# Patient Record
Sex: Male | Born: 1955 | Race: White | Hispanic: No | Smoking: Never smoker
Health system: Southern US, Community
[De-identification: ages and names within clinical notes are randomized; demographics above are authoritative.]

## PROBLEM LIST (undated history)

## (undated) DIAGNOSIS — R0602 Shortness of breath: Secondary | ICD-10-CM

## (undated) DIAGNOSIS — G8929 Other chronic pain: Secondary | ICD-10-CM

## (undated) DIAGNOSIS — K219 Gastro-esophageal reflux disease without esophagitis: Secondary | ICD-10-CM

## (undated) DIAGNOSIS — F3289 Other specified depressive episodes: Secondary | ICD-10-CM

## (undated) DIAGNOSIS — IMO0001 Reserved for inherently not codable concepts without codable children: Secondary | ICD-10-CM

## (undated) DIAGNOSIS — D649 Anemia, unspecified: Secondary | ICD-10-CM

## (undated) DIAGNOSIS — J984 Other disorders of lung: Secondary | ICD-10-CM

## (undated) DIAGNOSIS — I509 Heart failure, unspecified: Secondary | ICD-10-CM

## (undated) DIAGNOSIS — I10 Essential (primary) hypertension: Secondary | ICD-10-CM

## (undated) DIAGNOSIS — F329 Major depressive disorder, single episode, unspecified: Secondary | ICD-10-CM

## (undated) DIAGNOSIS — R519 Headache, unspecified: Secondary | ICD-10-CM

## (undated) DIAGNOSIS — J42 Unspecified chronic bronchitis: Secondary | ICD-10-CM

## (undated) DIAGNOSIS — R911 Solitary pulmonary nodule: Secondary | ICD-10-CM

## (undated) DIAGNOSIS — A419 Sepsis, unspecified organism: Secondary | ICD-10-CM

## (undated) DIAGNOSIS — S0181XA Laceration without foreign body of other part of head, initial encounter: Secondary | ICD-10-CM

## (undated) DIAGNOSIS — J189 Pneumonia, unspecified organism: Secondary | ICD-10-CM

## (undated) DIAGNOSIS — R079 Chest pain, unspecified: Secondary | ICD-10-CM

## (undated) DIAGNOSIS — E1165 Type 2 diabetes mellitus with hyperglycemia: Secondary | ICD-10-CM

## (undated) DIAGNOSIS — K7581 Nonalcoholic steatohepatitis (NASH): Secondary | ICD-10-CM

## (undated) DIAGNOSIS — S2249XA Multiple fractures of ribs, unspecified side, initial encounter for closed fracture: Secondary | ICD-10-CM

## (undated) DIAGNOSIS — N189 Chronic kidney disease, unspecified: Secondary | ICD-10-CM

## (undated) DIAGNOSIS — R51 Headache: Secondary | ICD-10-CM

## (undated) HISTORY — DX: Other specified depressive episodes: F32.89

## (undated) HISTORY — DX: Other disorders of lung: J98.4

## (undated) HISTORY — DX: Type 2 diabetes mellitus with hyperglycemia: E11.65

## (undated) HISTORY — DX: Gastro-esophageal reflux disease without esophagitis: K21.9

## (undated) HISTORY — DX: Headache, unspecified: R51.9

## (undated) HISTORY — DX: Reserved for inherently not codable concepts without codable children: IMO0001

## (undated) HISTORY — PX: NEPHROSTOMY: SHX1014

## (undated) HISTORY — DX: Shortness of breath: R06.02

## (undated) HISTORY — DX: Headache: R51

## (undated) HISTORY — DX: Essential (primary) hypertension: I10

## (undated) HISTORY — DX: Other chronic pain: G89.29

## (undated) HISTORY — DX: Nonalcoholic steatohepatitis (NASH): K75.81

## (undated) HISTORY — DX: Major depressive disorder, single episode, unspecified: F32.9

---

## 1974-09-25 HISTORY — PX: LUMBAR LAMINECTOMY: SHX95

## 1994-09-25 HISTORY — PX: OTHER SURGICAL HISTORY: SHX169

## 2007-09-26 HISTORY — PX: OTHER SURGICAL HISTORY: SHX169

## 2008-06-09 ENCOUNTER — Ambulatory Visit (HOSPITAL_COMMUNITY): Admission: RE | Admit: 2008-06-09 | Discharge: 2008-06-09 | Payer: Self-pay | Admitting: Urology

## 2011-04-07 DIAGNOSIS — R0602 Shortness of breath: Secondary | ICD-10-CM

## 2011-05-02 ENCOUNTER — Encounter: Payer: Self-pay | Admitting: Cardiology

## 2011-05-04 ENCOUNTER — Ambulatory Visit: Payer: Self-pay | Admitting: Cardiology

## 2011-07-09 DIAGNOSIS — R0789 Other chest pain: Secondary | ICD-10-CM

## 2011-07-10 DIAGNOSIS — R079 Chest pain, unspecified: Secondary | ICD-10-CM

## 2011-07-10 DIAGNOSIS — R0602 Shortness of breath: Secondary | ICD-10-CM

## 2013-05-13 DIAGNOSIS — R079 Chest pain, unspecified: Secondary | ICD-10-CM

## 2014-01-29 ENCOUNTER — Encounter (INDEPENDENT_AMBULATORY_CARE_PROVIDER_SITE_OTHER): Payer: Self-pay

## 2014-04-08 HISTORY — PX: COLONOSCOPY WITH ESOPHAGOGASTRODUODENOSCOPY (EGD): SHX5779

## 2014-04-08 HISTORY — PX: OTHER SURGICAL HISTORY: SHX169

## 2014-05-26 ENCOUNTER — Encounter: Payer: Self-pay | Admitting: Internal Medicine

## 2014-06-25 ENCOUNTER — Ambulatory Visit: Payer: Medicare Other | Admitting: Gastroenterology

## 2014-11-23 ENCOUNTER — Encounter: Payer: Self-pay | Admitting: Internal Medicine

## 2014-12-07 ENCOUNTER — Encounter: Payer: Self-pay | Admitting: Internal Medicine

## 2014-12-07 ENCOUNTER — Encounter: Payer: Self-pay | Admitting: Nurse Practitioner

## 2014-12-07 ENCOUNTER — Ambulatory Visit (INDEPENDENT_AMBULATORY_CARE_PROVIDER_SITE_OTHER): Payer: Medicare HMO | Admitting: Nurse Practitioner

## 2014-12-07 VITALS — BP 131/81 | HR 105 | Temp 97.0°F | Ht 72.0 in | Wt 278.2 lb

## 2014-12-07 DIAGNOSIS — K5900 Constipation, unspecified: Secondary | ICD-10-CM | POA: Insufficient documentation

## 2014-12-07 NOTE — Patient Instructions (Signed)
1. We will start you on Amitiza 24 mcg twice a day. Take the medicine with food to help prevent nausea 2. There is no concerns for kidney function with this medicine. 3. Call us and let us know how this medicine works for your symptoms. If it works well we can call in a prescription for you. If it doesn't work well we can try something else 4. Return for follow-up in 4 weeks (1 month)

## 2014-12-07 NOTE — Assessment & Plan Note (Signed)
Long standing history of constipation on chronic narcotic pain medications. Has a colonoscopy 6 months ago with Dr. Britta Mccreedy, will request those records. Denies reg flag/warning signs like overt GI bleeding, fever/chills, unintentional weight loss. Likely opiod-induced constipation, failed Miralax. Will trial Amitiza 24 mcg bid for symptom improvement. Return for follow-up in 4 weeks.

## 2014-12-07 NOTE — Progress Notes (Signed)
Primary Care Physician:  Deloria Lair, MD Primary Gastroenterologist:  Dr. Gala Romney  Chief Complaint  Patient presents with  . Constipation  . Anal Fissure  . swelling    HPI:   59 year old male presents on referral from urologist for chronic constipation and anal fissure.   Today he states he will occasionally have a bowel movement 2-3 times a day but will sometimes go 2-3 days without a bowel movement. The frequent stool days occur about every 2-3 weeks. This is episodic and will rotate all over the specturm from daily bowel movements to days without a bowel movement to days with 2-3 bowel movements. Tried Miralax daily, but when he doesn't take it then it takes "quite a while" to get back to normal. Will miss the Miralax about once a week. Admits straining when he's having a bout of constipation. Denies hemaotchezia and melena. Has dull abdominal and rectal pain with straining. Denies sharp rectal pain with straining. Denies any other abdominal pain. Does have an abdominal hernia. Admits occasional nausea when constipated but denies vomiting. Drinks about 4 glasses of water a day, consumes minimal fiber foods. Takes oxycodone 10/325 about 5-6 times a day for chronic back pain. Symptoms have been occurring "for years." Denies any other upper or lower GI problems. Last colonoscopy with Dr. Britta Mccreedy 6 months ago. Does not know specific results but heard "he may have some trouble back there." Will request these records. Denies fever, chills, unintentional weight loss, changes in appetite.   Past Medical History  Diagnosis Date  . Shortness of breath   . Type II or unspecified type diabetes mellitus without mention of complication, uncontrolled   . Other diseases of lung, not elsewhere classified   . Unspecified essential hypertension   . Depressive disorder, not elsewhere classified   . HBP (high blood pressure)   . GERD (gastroesophageal reflux disease)   . Steatohepatitis   . Chronic  headaches     Past Surgical History  Procedure Laterality Date  . Lumbar laminectomy  1976  . Cystoscopic left ureteral stone removal  1996  . Circumcision for chronic balanitis  2009    Current Outpatient Prescriptions  Medication Sig Dispense Refill  . albuterol (PROVENTIL,VENTOLIN) 90 MCG/ACT inhaler Inhale 2 puffs into the lungs every 6 (six) hours as needed.      . ALPRAZolam (XANAX) 1 MG tablet Take 1 mg by mouth 3 (three) times daily as needed.      Marland Kitchen amitriptyline (ELAVIL) 50 MG tablet Take 100 mg by mouth at bedtime.      Marland Kitchen aspirin 81 MG EC tablet Take 81 mg by mouth daily.      . B Complex Vitamins (B-COMPLEX/B-12 PO) Take 2,500 capsules by mouth daily.    . ciprofloxacin (CIPRO) 500 MG tablet Take 500 mg by mouth 2 (two) times daily.  0  . esomeprazole (NEXIUM) 20 MG capsule Take 20 mg by mouth daily before breakfast.      . fluticasone (VERAMYST) 27.5 MCG/SPRAY nasal spray Place 1 spray into the nose daily.      . Fluticasone-Salmeterol (ADVAIR DISKUS) 500-50 MCG/DOSE AEPB Inhale 1 puff into the lungs every 12 (twelve) hours.      . gabapentin (NEURONTIN) 300 MG capsule Take 600 mg by mouth 3 (three) times daily.      Marland Kitchen HYDROcodone-acetaminophen (LORCET) 10-650 MG per tablet Take 1 tablet by mouth every 4 (four) hours as needed.      . insulin glargine (  LANTUS) 100 UNIT/ML injection Inject 70 Units into the skin 2 (two) times daily.      . metoCLOPramide (REGLAN) 10 MG tablet Take 10 mg by mouth 4 (four) times daily. Before meals and at bedtime     . oxyCODONE-acetaminophen (PERCOCET) 10-325 MG per tablet 1 tablet every 4 (four) hours as needed for pain.   0  . PARoxetine (PAXIL) 40 MG tablet Take 40 mg by mouth every morning.      . tamsulosin (FLOMAX) 0.4 MG CAPS capsule Take 0.4 mg by mouth daily after supper.     No current facility-administered medications for this visit.    Allergies as of 12/07/2014  . (No Known Allergies)    No family history on  file.  History   Social History  . Marital Status: Married    Spouse Name: MAE  . Number of Children: 0  . Years of Education: N/A   Occupational History  . DISABLED    Social History Main Topics  . Smoking status: Never Smoker   . Smokeless tobacco: Not on file  . Alcohol Use: No  . Drug Use: No  . Sexual Activity: Not on file   Other Topics Concern  . Not on file   Social History Narrative    Review of Systems: General: Negative for anorexia, weight loss, fever, chills, fatigue, weakness. ENT: Negative for hoarseness, difficulty swallowing. CV: Negative for chest pain, angina, palpitations, peripheral edema.  Respiratory: Negative for dyspnea at rest, dyspnea on exertion, wheezing.  GI: See history of present illness. MS: Admits low back pain consistent with history, no worse than baseline.  Derm: Negative for rash or itching.  Neuro: Negative for weakness, seizure, frequent headaches, memory loss, confusion.  Psych: Negative for anxiety, depression.  Endo: Negative for unusual weight change.  Heme: Negative for bruising or bleeding. Allergy: Negative for rash or hives.   Physical Exam: BP 131/81 mmHg  Pulse 105  Temp(Src) 97 F (36.1 C)  Ht 6' (1.829 m)  Wt 278 lb 3.2 oz (126.191 kg)  BMI 37.72 kg/m2 General:   Alert and oriented. Pleasant and cooperative. Well-nourished and well-developed.  Head:  Normocephalic and atraumatic. Eyes:  Without icterus, sclera clear and conjunctiva pink.  Ears:  Normal auditory acuity. Mouth:  No deformity or lesions, oral mucosa pink. No OP edema. Neck:  Supple, without mass or thyromegaly. Lungs:  Clear to auscultation bilaterally. No wheezes, rales, or rhonchi. No distress.  Heart:  S1, S2 present without murmurs appreciated.  Abdomen:  Rounded, +BS, soft, non-tender and non-distended. No HSM noted. No guarding or rebound. No masses appreciated. Abdominal hernia noted and is soft and easily reducible. Rectal:  Deferred   Msk:  Symmetrical without gross deformities. Normal posture. Pulses:  Normal pulses noted. Extremities:  Without clubbing or edema. Neurologic:  Alert and  oriented x4;  grossly normal neurologically. Skin:  Intact without significant lesions or rashes. Cervical Nodes:  No significant cervical adenopathy. Psych:  Alert and cooperative. Normal mood and affect.     12/07/2014 1:06 PM

## 2014-12-08 NOTE — Progress Notes (Signed)
cc'ed to pcp °

## 2015-01-07 ENCOUNTER — Encounter: Payer: Self-pay | Admitting: Nurse Practitioner

## 2015-01-07 ENCOUNTER — Ambulatory Visit (INDEPENDENT_AMBULATORY_CARE_PROVIDER_SITE_OTHER): Payer: Medicare HMO | Admitting: Nurse Practitioner

## 2015-01-07 VITALS — BP 141/93 | HR 100 | Temp 96.9°F | Ht 73.0 in | Wt 266.4 lb

## 2015-01-07 DIAGNOSIS — K5900 Constipation, unspecified: Secondary | ICD-10-CM | POA: Diagnosis not present

## 2015-01-07 MED ORDER — LUBIPROSTONE 24 MCG PO CAPS: 24.0000 ug | ORAL_CAPSULE | Freq: Two times a day (BID) | ORAL | Status: DC

## 2015-01-07 NOTE — Patient Instructions (Signed)
1. We sent in a prescription to your pharmacy for the Amitiza for constipation. Take it per the label instructions 2. Return for a follow-up in 3 months so we can check your progress.

## 2015-01-07 NOTE — Assessment & Plan Note (Signed)
59 year old male with long-standing history of constipation on chronic opioid pain medications for chronic back pain. At last visit he was complaining of constipation often going 3 or more days in between bowel movements. We started him on Amitiza 24 g twice a day. Today he states he's had a vast improvement in his symptoms. Denies any further abdominal pain, longest time in between bowel movements is one to 2 days. Denies any further straining. Denies hematochezia and melena. We will continue his Amitiza 24 g twice a day, prescription to be sent into pharmacy. Follow-up in 3 months for reevaluation of his symptoms.

## 2015-01-07 NOTE — Progress Notes (Signed)
Referring Provider: Deloria Lair., MD Primary Care Physician:  Deloria Lair, MD Primary GI: Dr. Gala Romney  Chief Complaint  Patient presents with  . Follow-up    HPI:   59 year old male presents for follow-up on constipation. The last office visit last month patient described multiple year history of constipation, takes chronic opioid pain medications including Vicodin 10/325 up to 5 times a day for chronic back pain. Failed MiraLAX with continued constipation and straining despite daily use. At last office visit he was trialed on Amitiza 24 g twice a day for symptom improvement with a routine follow-up in 4 weeks. We have since received his previous colonoscopy report mentioned previous note which is completed by Dr. Britta Mccreedy at [facility]. Findings included megacolon, no colonic or rectal polyps. Recommendation of iron pills, continue Nexium, laxatives as needed. Recommend repeat in 10 years.  Today he states he has 2-3 BMs a day and will go as long as 1-2 days between BMs, which is an improvement from last visit. Also states his abdominal pain is improved. Denies nausea and vomiting. Does not have to strain anymore either. Denies any other upper or lower GI problems. Admits to some GU symptoms including burning with urination and urinary frequency. Has an appointment scheduled with his PCP tomorrow morning.  Past Medical History  Diagnosis Date  . Shortness of breath   . Type II or unspecified type diabetes mellitus without mention of complication, uncontrolled   . Other diseases of lung, not elsewhere classified   . Unspecified essential hypertension   . Depressive disorder, not elsewhere classified   . HBP (high blood pressure)   . GERD (gastroesophageal reflux disease)   . Steatohepatitis   . Chronic headaches     Past Surgical History  Procedure Laterality Date  . Lumbar laminectomy  1976  . Cystoscopic left ureteral stone removal  1996  . Circumcision for  chronic balanitis  2009    Current Outpatient Prescriptions  Medication Sig Dispense Refill  . albuterol (PROVENTIL,VENTOLIN) 90 MCG/ACT inhaler Inhale 2 puffs into the lungs every 6 (six) hours as needed.      . ALPRAZolam (XANAX) 1 MG tablet Take 1 mg by mouth 3 (three) times daily as needed.      Marland Kitchen amitriptyline (ELAVIL) 50 MG tablet Take 100 mg by mouth at bedtime.      Marland Kitchen aspirin 81 MG EC tablet Take 81 mg by mouth daily.      . B Complex Vitamins (B-COMPLEX/B-12 PO) Take 2,500 capsules by mouth daily.    Marland Kitchen esomeprazole (NEXIUM) 20 MG capsule Take 20 mg by mouth daily before breakfast.      . fluticasone (VERAMYST) 27.5 MCG/SPRAY nasal spray Place 1 spray into the nose daily.      . Fluticasone-Salmeterol (ADVAIR DISKUS) 500-50 MCG/DOSE AEPB Inhale 1 puff into the lungs every 12 (twelve) hours.      . gabapentin (NEURONTIN) 300 MG capsule Take 600 mg by mouth 3 (three) times daily.      Marland Kitchen HYDROcodone-acetaminophen (LORCET) 10-650 MG per tablet Take 1 tablet by mouth every 4 (four) hours as needed.      . insulin glargine (LANTUS) 100 UNIT/ML injection Inject 70 Units into the skin 2 (two) times daily.      . metoCLOPramide (REGLAN) 10 MG tablet Take 10 mg by mouth 4 (four) times daily. Before meals and at bedtime     . oxyCODONE-acetaminophen (PERCOCET) 10-325 MG per tablet 1 tablet every 4 (  four) hours as needed for pain.   0  . PARoxetine (PAXIL) 40 MG tablet Take 40 mg by mouth every morning.      . tamsulosin (FLOMAX) 0.4 MG CAPS capsule Take 0.4 mg by mouth daily after supper.    . ciprofloxacin (CIPRO) 500 MG tablet Take 500 mg by mouth 2 (two) times daily.  0   No current facility-administered medications for this visit.    Allergies as of 01/07/2015 - Review Complete 01/07/2015  Allergen Reaction Noted  . Meperidine Other (See Comments) 01/07/2015    No family history on file.  History   Social History  . Marital Status: Married    Spouse Name: MAE  . Number of  Children: 0  . Years of Education: N/A   Occupational History  . DISABLED    Social History Main Topics  . Smoking status: Never Smoker   . Smokeless tobacco: Not on file  . Alcohol Use: No  . Drug Use: No  . Sexual Activity: Not on file   Other Topics Concern  . None   Social History Narrative    Review of Systems: Gen: Denies fever, chills, anorexia. Denies fatigue, weakness, weight loss. Feels achy though. CV: Denies chest pain, palpitations, syncope. Resp: Denies dyspnea at rest, cough, wheezing, coughing up blood, and pleurisy. GI: Denies vomiting blood, jaundice, and fecal incontinence.   Denies dysphagia or odynophagia. GU: Admits painful urination and frequency. Derm: Denies rash, itching, dry skin Psych: Denies depression, anxiety, memory loss, confusion.  Heme: Denies bruising, bleeding, and enlarged lymph nodes.  Physical Exam: BP 141/93 mmHg  Pulse 100  Temp(Src) 96.9 F (36.1 C)  Ht 6' 1"  (1.854 m)  Wt 266 lb 6.4 oz (120.838 kg)  BMI 35.15 kg/m2 General:   Alert and oriented. No distress noted. Pleasant and cooperative.  Head:  Normocephalic and atraumatic. Eyes:  Conjuctiva clear without scleral icterus. Lungs:  Clear to auscultation bilaterally. No wheezes, rales, or rhonchi. No distress.  Heart:  S1, S2 present without murmurs, rubs, or gallops. Regular rate and rhythm. Abdomen:  +BS, soft, non-tender and non-distended. No rebound or guarding. No HSM or masses noted. Noted midline abdominal hernia is soft. Extremities:  Without edema. Neurologic:  Alert and  oriented x4;  grossly normal neurologically. Skin:  Intact without significant lesions or rashes. Psych:  Alert and cooperative. Normal mood and affect.    01/07/2015 8:11 AM

## 2015-01-07 NOTE — Progress Notes (Signed)
cc'ed to pcp °

## 2015-01-27 ENCOUNTER — Encounter: Payer: Self-pay | Admitting: Internal Medicine

## 2015-04-08 ENCOUNTER — Encounter: Payer: Self-pay | Admitting: Nurse Practitioner

## 2015-04-08 ENCOUNTER — Ambulatory Visit (INDEPENDENT_AMBULATORY_CARE_PROVIDER_SITE_OTHER): Payer: Medicare HMO | Admitting: Nurse Practitioner

## 2015-04-08 VITALS — BP 129/77 | HR 109 | Temp 97.5°F | Ht 73.0 in | Wt 270.0 lb

## 2015-04-08 DIAGNOSIS — K5900 Constipation, unspecified: Secondary | ICD-10-CM

## 2015-04-08 NOTE — Assessment & Plan Note (Signed)
Patient with constipation likely due to narcotic pain medication for chronic pain. Amitiza worked initially however stop working so he stopped taking it. He has noted that dairy causes severe constipation and he has been avoiding dairy. Between avoiding dairy and adding daily MiraLAX he has a daily bowel movement with no straining and is satisfied with the results. If he misses a dose of MiraLAX however he will have worsening constipation which take 2-3 days ago however. At this point we'll have him continue daily MiraLAX, avoid dairy, and we'll add over-the-counter Colace stool softener given his narcotic pain medications. Return for follow-up in 6 months to assess long-term effects of this management plan.

## 2015-04-08 NOTE — Progress Notes (Signed)
Referring Provider: Deloria Lair., MD Primary Care Physician:  Deloria Lair, MD Primary GI:  Dr. Gala Romney  Chief Complaint  Patient presents with  . Constipation    HPI:   59 year old male presents for follow-up on constipation. Last visit on 01/07/2015 patient was much improved with to 3 BMs a day and is infrequently is 1-2 days in between BMs. Abdominal pain was also improved. No further straining. Colonoscopy up-to-date completed in 2015 with recommended repeat in 10 years. Was last prescribed Amitiza.  Today he states the Amitiza worked initially but then stopped working so he's stopped taking it. He has been taking Miralax daily and avoiding dairy. He states if he has any dairy products he'll be severely constipated. If he avoids dairy Mirlax adequately controls his symptoms. Denies abdominal pain. Currently having a bowel movement about once a day, rarely has to strain. Denies hematochezia, melena, vomiting. Does have bloating with early satiety and associated nausea for which he takes Reglan and quickly resolves. Denies chest pain, dyspnea, dizziness, lightheadedness, syncope, near syncope. Denies any other upper or lower GI symptoms.  Past Medical History  Diagnosis Date  . Shortness of breath   . Type II or unspecified type diabetes mellitus without mention of complication, uncontrolled   . Other diseases of lung, not elsewhere classified   . Unspecified essential hypertension   . Depressive disorder, not elsewhere classified   . HBP (high blood pressure)   . GERD (gastroesophageal reflux disease)   . Steatohepatitis   . Chronic headaches     Past Surgical History  Procedure Laterality Date  . Lumbar laminectomy  1976  . Cystoscopic left ureteral stone removal  1996  . Circumcision for chronic balanitis  2009    Current Outpatient Prescriptions  Medication Sig Dispense Refill  . albuterol (PROVENTIL,VENTOLIN) 90 MCG/ACT inhaler Inhale 2 puffs into the lungs every  6 (six) hours as needed.      . ALPRAZolam (XANAX) 1 MG tablet Take 1 mg by mouth 3 (three) times daily as needed.      Marland Kitchen amitriptyline (ELAVIL) 50 MG tablet Take 100 mg by mouth at bedtime.      Marland Kitchen aspirin 81 MG EC tablet Take 81 mg by mouth daily.      . B Complex Vitamins (B-COMPLEX/B-12 PO) Take 2,500 capsules by mouth daily.    Marland Kitchen esomeprazole (NEXIUM) 20 MG capsule Take 20 mg by mouth daily before breakfast.      . fluticasone (VERAMYST) 27.5 MCG/SPRAY nasal spray Place 1 spray into the nose daily.      . Fluticasone-Salmeterol (ADVAIR DISKUS) 500-50 MCG/DOSE AEPB Inhale 1 puff into the lungs every 12 (twelve) hours.      . gabapentin (NEURONTIN) 300 MG capsule Take 600 mg by mouth 3 (three) times daily.      . insulin glargine (LANTUS) 100 UNIT/ML injection Inject 70 Units into the skin 2 (two) times daily.      . metoCLOPramide (REGLAN) 10 MG tablet Take 10 mg by mouth 4 (four) times daily. Before meals and at bedtime     . oxyCODONE-acetaminophen (PERCOCET) 10-325 MG per tablet 1 tablet every 4 (four) hours as needed for pain.   0  . PARoxetine (PAXIL) 40 MG tablet Take 40 mg by mouth every morning.      . tamsulosin (FLOMAX) 0.4 MG CAPS capsule Take 0.4 mg by mouth daily after supper.    . ciprofloxacin (CIPRO) 500 MG tablet Take 500 mg by mouth  2 (two) times daily.  0  . lubiprostone (AMITIZA) 24 MCG capsule Take 1 capsule (24 mcg total) by mouth 2 (two) times daily with a meal. (Patient not taking: Reported on 04/08/2015) 60 capsule 3   No current facility-administered medications for this visit.    Allergies as of 04/08/2015 - Review Complete 04/08/2015  Allergen Reaction Noted  . Meperidine Other (See Comments) 01/07/2015    No family history on file.  History   Social History  . Marital Status: Married    Spouse Name: MAE  . Number of Children: 0  . Years of Education: N/A   Occupational History  . DISABLED    Social History Main Topics  . Smoking status: Never  Smoker   . Smokeless tobacco: Not on file  . Alcohol Use: No  . Drug Use: No  . Sexual Activity: Not on file   Other Topics Concern  . None   Social History Narrative    Review of Systems: General: Negative for anorexia, weight loss, fever, chills, fatigue, weakness. CV: Negative for chest pain, angina, palpitations, dyspnea on exertion, peripheral edema.  Respiratory: Negative for dyspnea at rest, dyspnea on exertion, cough, sputum, wheezing.  GI: See history of present illness. Endo: Negative for unusual weight change.  Heme: Negative for bruising or bleeding. Allergy: Negative for rash or hives.   Physical Exam: BP 129/77 mmHg  Pulse 109  Temp(Src) 97.5 F (36.4 C) (Oral)  Ht 6' 1"  (1.854 m)  Wt 270 lb (122.471 kg)  BMI 35.63 kg/m2 General:   Alert and oriented. Pleasant and cooperative. Well-nourished and well-developed.  Head:  Normocephalic and atraumatic. Cardiovascular:  S1, S2 present without murmurs appreciated. Normal pulses noted. Extremities without clubbing or edema. Respiratory:  Clear to auscultation bilaterally. No wheezes, rales, or rhonchi. No distress.  Gastrointestinal:  +BS, obese, soft, non-tender and non-distended. No HSM noted. No guarding or rebound. No masses appreciated. Midline abdominal hernia noted and non-tender, easily reducible. Rectal:  Deferred  Neurologic:  Alert and oriented x4;  grossly normal neurologically. Psych:  Alert and cooperative. Normal mood and affect. Heme/Lymph/Immune: No excessive bruising noted.    04/08/2015 1:33 PM

## 2015-04-08 NOTE — Patient Instructions (Signed)
1. Continue to avoid dairy products. 2. Continue taking MiraLAX daily. 3. Add over-the-counter Colace stool softener to help promote regular bowel movements and no straining. 4. Return for follow-up in 6 months.

## 2015-04-09 NOTE — Progress Notes (Signed)
cc'ed to pcp °

## 2015-09-15 ENCOUNTER — Encounter: Payer: Self-pay | Admitting: Internal Medicine

## 2016-06-08 ENCOUNTER — Other Ambulatory Visit (HOSPITAL_COMMUNITY): Payer: Self-pay | Admitting: Respiratory Therapy

## 2016-06-08 DIAGNOSIS — J441 Chronic obstructive pulmonary disease with (acute) exacerbation: Secondary | ICD-10-CM

## 2016-06-16 ENCOUNTER — Ambulatory Visit (HOSPITAL_COMMUNITY)
Admission: RE | Admit: 2016-06-16 | Discharge: 2016-06-16 | Disposition: A | Discharge status: Home / Self Care | Payer: Medicare HMO | Source: Ambulatory Visit | Attending: Pulmonary Disease | Admitting: Pulmonary Disease

## 2016-06-16 DIAGNOSIS — J441 Chronic obstructive pulmonary disease with (acute) exacerbation: Secondary | ICD-10-CM | POA: Insufficient documentation

## 2016-06-16 MED ORDER — ALBUTEROL SULFATE (2.5 MG/3ML) 0.083% IN NEBU
2.5000 mg | INHALATION_SOLUTION | Freq: Once | RESPIRATORY_TRACT | Status: AC
Start: 2016-06-16 — End: 2016-06-16
  Administered 2016-06-16: 2.5 mg via RESPIRATORY_TRACT

## 2016-06-18 LAB — PULMONARY FUNCTION TEST
DL/VA % PRED: 100 %
DL/VA: 4.8 ml/min/mmHg/L
DLCO UNC: 20.78 ml/min/mmHg
DLCO unc % pred: 57 %
FEF 25-75 POST: 1.64 L/s
FEF 25-75 Pre: 1.57 L/sec
FEF2575-%Change-Post: 4 %
FEF2575-%PRED-POST: 52 %
FEF2575-%Pred-Pre: 50 %
FEV1-%CHANGE-POST: 0 %
FEV1-%PRED-PRE: 52 %
FEV1-%Pred-Post: 51 %
FEV1-Post: 2.03 L
FEV1-Pre: 2.05 L
FEV1FVC-%Change-Post: 1 %
FEV1FVC-%PRED-PRE: 99 %
FEV6-%Change-Post: -2 %
FEV6-%Pred-Post: 53 %
FEV6-%Pred-Pre: 55 %
FEV6-Post: 2.67 L
FEV6-Pre: 2.74 L
FEV6FVC-%CHANGE-POST: 0 %
FEV6FVC-%Pred-Post: 104 %
FEV6FVC-%Pred-Pre: 105 %
FVC-%Change-Post: -2 %
FVC-%PRED-POST: 51 %
FVC-%PRED-PRE: 52 %
FVC-PRE: 2.74 L
FVC-Post: 2.68 L
POST FEV1/FVC RATIO: 76 %
PRE FEV6/FVC RATIO: 100 %
Post FEV6/FVC ratio: 100 %
Pre FEV1/FVC ratio: 75 %
RV % pred: 170 %
RV: 4.2 L
TLC % pred: 92 %
TLC: 7.02 L

## 2016-10-20 ENCOUNTER — Observation Stay (HOSPITAL_COMMUNITY): Payer: Medicare HMO

## 2016-10-20 ENCOUNTER — Emergency Department (HOSPITAL_COMMUNITY): Payer: Medicare HMO

## 2016-10-20 ENCOUNTER — Observation Stay (HOSPITAL_COMMUNITY)
Admit: 2016-10-20 | Discharge: 2016-10-20 | Disposition: A | Discharge status: Home / Self Care | Payer: Medicare HMO | Attending: Neurology | Admitting: Neurology

## 2016-10-20 ENCOUNTER — Observation Stay (HOSPITAL_BASED_OUTPATIENT_CLINIC_OR_DEPARTMENT_OTHER): Payer: Medicare HMO

## 2016-10-20 ENCOUNTER — Observation Stay (HOSPITAL_COMMUNITY)
Admission: EM | Admit: 2016-10-20 | Discharge: 2016-10-21 | Disposition: A | Discharge status: Home / Self Care | Payer: Medicare HMO | Attending: Internal Medicine | Admitting: Internal Medicine

## 2016-10-20 ENCOUNTER — Encounter (HOSPITAL_COMMUNITY): Payer: Self-pay | Admitting: *Deleted

## 2016-10-20 DIAGNOSIS — Z7982 Long term (current) use of aspirin: Secondary | ICD-10-CM | POA: Insufficient documentation

## 2016-10-20 DIAGNOSIS — N289 Disorder of kidney and ureter, unspecified: Secondary | ICD-10-CM | POA: Diagnosis not present

## 2016-10-20 DIAGNOSIS — F418 Other specified anxiety disorders: Secondary | ICD-10-CM | POA: Diagnosis present

## 2016-10-20 DIAGNOSIS — G894 Chronic pain syndrome: Secondary | ICD-10-CM | POA: Diagnosis present

## 2016-10-20 DIAGNOSIS — Z79899 Other long term (current) drug therapy: Secondary | ICD-10-CM | POA: Diagnosis not present

## 2016-10-20 DIAGNOSIS — N183 Chronic kidney disease, stage 3 unspecified: Secondary | ICD-10-CM | POA: Diagnosis present

## 2016-10-20 DIAGNOSIS — G9340 Encephalopathy, unspecified: Principal | ICD-10-CM | POA: Diagnosis present

## 2016-10-20 DIAGNOSIS — I130 Hypertensive heart and chronic kidney disease with heart failure and stage 1 through stage 4 chronic kidney disease, or unspecified chronic kidney disease: Secondary | ICD-10-CM | POA: Insufficient documentation

## 2016-10-20 DIAGNOSIS — E1122 Type 2 diabetes mellitus with diabetic chronic kidney disease: Secondary | ICD-10-CM | POA: Diagnosis not present

## 2016-10-20 DIAGNOSIS — J9611 Chronic respiratory failure with hypoxia: Secondary | ICD-10-CM | POA: Diagnosis present

## 2016-10-20 DIAGNOSIS — I509 Heart failure, unspecified: Secondary | ICD-10-CM | POA: Diagnosis not present

## 2016-10-20 DIAGNOSIS — Z79891 Long term (current) use of opiate analgesic: Secondary | ICD-10-CM | POA: Diagnosis not present

## 2016-10-20 DIAGNOSIS — E1121 Type 2 diabetes mellitus with diabetic nephropathy: Secondary | ICD-10-CM | POA: Diagnosis present

## 2016-10-20 DIAGNOSIS — R4182 Altered mental status, unspecified: Secondary | ICD-10-CM | POA: Diagnosis present

## 2016-10-20 DIAGNOSIS — J449 Chronic obstructive pulmonary disease, unspecified: Secondary | ICD-10-CM | POA: Diagnosis present

## 2016-10-20 HISTORY — DX: Solitary pulmonary nodule: R91.1

## 2016-10-20 HISTORY — DX: Morbid (severe) obesity due to excess calories: E66.01

## 2016-10-20 HISTORY — DX: Heart failure, unspecified: I50.9

## 2016-10-20 HISTORY — DX: Laceration without foreign body of other part of head, initial encounter: S01.81XA

## 2016-10-20 HISTORY — DX: Unspecified chronic bronchitis: J42

## 2016-10-20 HISTORY — DX: Chronic kidney disease, unspecified: N18.9

## 2016-10-20 HISTORY — DX: Sepsis, unspecified organism: A41.9

## 2016-10-20 HISTORY — DX: Multiple fractures of ribs, unspecified side, initial encounter for closed fracture: S22.49XA

## 2016-10-20 HISTORY — DX: Pneumonia, unspecified organism: J18.9

## 2016-10-20 HISTORY — DX: Chest pain, unspecified: R07.9

## 2016-10-20 HISTORY — DX: Anemia, unspecified: D64.9

## 2016-10-20 LAB — ECHOCARDIOGRAM COMPLETE
HEIGHTINCHES: 74 in
Weight: 4608 oz

## 2016-10-20 LAB — VITAMIN B12: Vitamin B-12: 1090 pg/mL — ABNORMAL HIGH (ref 180–914)

## 2016-10-20 LAB — URINALYSIS, ROUTINE W REFLEX MICROSCOPIC
BACTERIA UA: NONE SEEN
BILIRUBIN URINE: NEGATIVE
Glucose, UA: 50 mg/dL — AB
KETONES UR: NEGATIVE mg/dL
LEUKOCYTES UA: NEGATIVE
NITRITE: NEGATIVE
Protein, ur: NEGATIVE mg/dL
SPECIFIC GRAVITY, URINE: 1.012 (ref 1.005–1.030)
pH: 5 (ref 5.0–8.0)

## 2016-10-20 LAB — CBG MONITORING, ED
Glucose-Capillary: 160 mg/dL — ABNORMAL HIGH (ref 65–99)
Glucose-Capillary: 201 mg/dL — ABNORMAL HIGH (ref 65–99)
Glucose-Capillary: 166 mg/dL — ABNORMAL HIGH (ref 65–99)

## 2016-10-20 LAB — BLOOD GAS, ARTERIAL
ACID-BASE EXCESS: 4.8 mmol/L — AB (ref 0.0–2.0)
ALLENS TEST (PASS/FAIL): POSITIVE — AB
Bicarbonate: 28.1 mmol/L — ABNORMAL HIGH (ref 20.0–28.0)
DRAWN BY: 382351
FIO2: 28
O2 Saturation: 95.9 %
PCO2 ART: 50.7 mmHg — AB (ref 32.0–48.0)
PH ART: 7.384 (ref 7.350–7.450)
Patient temperature: 37
pO2, Arterial: 83.7 mmHg (ref 83.0–108.0)

## 2016-10-20 LAB — COMPREHENSIVE METABOLIC PANEL
ALBUMIN: 3.5 g/dL (ref 3.5–5.0)
ALT: 18 U/L (ref 17–63)
AST: 23 U/L (ref 15–41)
Alkaline Phosphatase: 65 U/L (ref 38–126)
Anion gap: 9 (ref 5–15)
BUN: 29 mg/dL — AB (ref 6–20)
CHLORIDE: 94 mmol/L — AB (ref 101–111)
CO2: 31 mmol/L (ref 22–32)
CREATININE: 2.55 mg/dL — AB (ref 0.61–1.24)
Calcium: 8.7 mg/dL — ABNORMAL LOW (ref 8.9–10.3)
GFR calc Af Amer: 29 mL/min — ABNORMAL LOW (ref >60)
GFR, EST NON AFRICAN AMERICAN: 25 mL/min — AB (ref >60)
GLUCOSE: 148 mg/dL — AB (ref 65–99)
Potassium: 3.8 mmol/L (ref 3.5–5.1)
SODIUM: 134 mmol/L — AB (ref 135–145)
Total Bilirubin: 0.7 mg/dL (ref 0.3–1.2)
Total Protein: 8 g/dL (ref 6.5–8.1)

## 2016-10-20 LAB — RAPID URINE DRUG SCREEN, HOSP PERFORMED
Amphetamines: NOT DETECTED
BARBITURATES: NOT DETECTED
Benzodiazepines: POSITIVE — AB
COCAINE: NOT DETECTED
OPIATES: NOT DETECTED
TETRAHYDROCANNABINOL: NOT DETECTED

## 2016-10-20 LAB — GLUCOSE, CAPILLARY
GLUCOSE-CAPILLARY: 140 mg/dL — AB (ref 65–99)
GLUCOSE-CAPILLARY: 215 mg/dL — AB (ref 65–99)

## 2016-10-20 LAB — CBC
HEMATOCRIT: 35.2 % — AB (ref 39.0–52.0)
Hemoglobin: 11.6 g/dL — ABNORMAL LOW (ref 13.0–17.0)
MCH: 30.1 pg (ref 26.0–34.0)
MCHC: 33 g/dL (ref 30.0–36.0)
MCV: 91.4 fL (ref 78.0–100.0)
PLATELETS: 214 10*3/uL (ref 150–400)
RBC: 3.85 MIL/uL — ABNORMAL LOW (ref 4.22–5.81)
RDW: 13.1 % (ref 11.5–15.5)
WBC: 7.9 10*3/uL (ref 4.0–10.5)

## 2016-10-20 LAB — AMMONIA: Ammonia: 36 umol/L — ABNORMAL HIGH (ref 9–35)

## 2016-10-20 LAB — BRAIN NATRIURETIC PEPTIDE: B Natriuretic Peptide: 38 pg/mL (ref 0.0–100.0)

## 2016-10-20 LAB — TSH: TSH: 2.049 u[IU]/mL (ref 0.350–4.500)

## 2016-10-20 LAB — TROPONIN I

## 2016-10-20 LAB — LACTIC ACID, PLASMA: LACTIC ACID, VENOUS: 0.7 mmol/L (ref 0.5–1.9)

## 2016-10-20 MED ORDER — ALBUTEROL (5 MG/ML) CONTINUOUS INHALATION SOLN
10.0000 mg/h | INHALATION_SOLUTION | Freq: Once | RESPIRATORY_TRACT | Status: AC
  Administered 2016-10-20: 10 mg/h via RESPIRATORY_TRACT
  Filled 2016-10-20: qty 20

## 2016-10-20 MED ORDER — SODIUM CHLORIDE 0.9% FLUSH: 3.0000 mL | Freq: Two times a day (BID) | INTRAVENOUS | Status: DC

## 2016-10-20 MED ORDER — PANTOPRAZOLE SODIUM 40 MG PO TBEC
40.0000 mg | DELAYED_RELEASE_TABLET | Freq: Every day | ORAL | Status: DC
  Administered 2016-10-20: 40 mg via ORAL
  Filled 2016-10-20: qty 1

## 2016-10-20 MED ORDER — MOMETASONE FURO-FORMOTEROL FUM 200-5 MCG/ACT IN AERO
INHALATION_SPRAY | RESPIRATORY_TRACT | Status: AC
  Filled 2016-10-20: qty 8.8

## 2016-10-20 MED ORDER — SODIUM CHLORIDE 0.45 % IV SOLN
INTRAVENOUS | Status: DC
  Administered 2016-10-20: 1000 mL via INTRAVENOUS

## 2016-10-20 MED ORDER — GABAPENTIN 100 MG PO CAPS
100.0000 mg | ORAL_CAPSULE | Freq: Three times a day (TID) | ORAL | Status: DC
  Administered 2016-10-20 (×3): 100 mg via ORAL
  Filled 2016-10-20 (×3): qty 1

## 2016-10-20 MED ORDER — IPRATROPIUM-ALBUTEROL 0.5-2.5 (3) MG/3ML IN SOLN
3.0000 mL | Freq: Three times a day (TID) | RESPIRATORY_TRACT | Status: DC
  Administered 2016-10-20 – 2016-10-21 (×3): 3 mL via RESPIRATORY_TRACT
  Filled 2016-10-20 (×3): qty 3

## 2016-10-20 MED ORDER — SODIUM CHLORIDE 0.9% FLUSH: 3.0000 mL | INTRAVENOUS | Status: DC | PRN

## 2016-10-20 MED ORDER — INSULIN GLARGINE 100 UNIT/ML ~~LOC~~ SOLN
5.0000 [IU] | Freq: Two times a day (BID) | SUBCUTANEOUS | Status: DC
  Administered 2016-10-20 (×2): 5 [IU] via SUBCUTANEOUS
  Filled 2016-10-20 (×7): qty 0.05

## 2016-10-20 MED ORDER — ASPIRIN 81 MG PO CHEW
81.0000 mg | CHEWABLE_TABLET | Freq: Every day | ORAL | Status: DC
  Administered 2016-10-20: 81 mg via ORAL
  Filled 2016-10-20 (×2): qty 1

## 2016-10-20 MED ORDER — INSULIN ASPART 100 UNIT/ML ~~LOC~~ SOLN
0.0000 [IU] | SUBCUTANEOUS | Status: DC
  Administered 2016-10-20: 3 [IU] via SUBCUTANEOUS
  Administered 2016-10-20: 2 [IU] via SUBCUTANEOUS
  Administered 2016-10-20: 3 [IU] via SUBCUTANEOUS
  Administered 2016-10-20: 1 [IU] via SUBCUTANEOUS
  Administered 2016-10-21: 2 [IU] via SUBCUTANEOUS
  Administered 2016-10-21: 5 [IU] via SUBCUTANEOUS
  Filled 2016-10-20 (×2): qty 1

## 2016-10-20 MED ORDER — TAMSULOSIN HCL 0.4 MG PO CAPS
0.4000 mg | ORAL_CAPSULE | Freq: Every day | ORAL | Status: DC
  Administered 2016-10-20: 0.4 mg via ORAL
  Filled 2016-10-20: qty 1

## 2016-10-20 MED ORDER — FUROSEMIDE 10 MG/ML IJ SOLN
20.0000 mg | Freq: Every day | INTRAMUSCULAR | Status: DC
  Administered 2016-10-20 – 2016-10-21 (×2): 20 mg via INTRAVENOUS
  Filled 2016-10-20 (×2): qty 2

## 2016-10-20 MED ORDER — POTASSIUM CHLORIDE IN NACL 20-0.9 MEQ/L-% IV SOLN
INTRAVENOUS | Status: DC
  Administered 2016-10-20 – 2016-10-21 (×2): via INTRAVENOUS
  Filled 2016-10-20: qty 1000

## 2016-10-20 MED ORDER — PAROXETINE HCL 20 MG PO TABS: 40.0000 mg | ORAL_TABLET | Freq: Every day | ORAL | Status: DC

## 2016-10-20 MED ORDER — SODIUM CHLORIDE 0.9 % IV SOLN: 250.0000 mL | INTRAVENOUS | Status: DC | PRN

## 2016-10-20 MED ORDER — MOMETASONE FURO-FORMOTEROL FUM 200-5 MCG/ACT IN AERO
2.0000 | INHALATION_SPRAY | Freq: Two times a day (BID) | RESPIRATORY_TRACT | Status: DC
  Administered 2016-10-20 – 2016-10-21 (×2): 2 via RESPIRATORY_TRACT
  Filled 2016-10-20: qty 8.8

## 2016-10-20 NOTE — ED Notes (Signed)
Pt taking to MRI

## 2016-10-20 NOTE — Progress Notes (Signed)
Patient is a 61 year old man with a history of COPD, CKD, CHF, diabetes mellitus, depression/anxiety, chronic pain syndrome/chronic headaches, morbid obesity, who was admitted this morning by Dr. Shanon Brow for altered mental status. Patient was just discharged from the [facility] ED on 10/19/16 for AMS, UTI, and chest pain. Patient was briefly seen and examined. His chart was reviewed. Agree with current management with additions below.  -On exam, the patient is still encephalopathic and lethargic. He answers to his name and follows small commands, but falls back to sleep. -In review of the ED records for [facility], the patient had a marginally elevated troponin I, UTI, and urine drug screen positive for opiates. He was discharged from the ED on Cipro. -UDS this morning was positive for benzodiazepines and negative for opiates. -Troponin I is within normal limits. TSH is within normal limits. Ammonia level is virtually within normal limits. UA is essentially negative. CT of the head is negative. Chest x-ray reveals stable cardiomegaly with interval development of pulmonary edema, possible small left pleural effusion, and chronic elevation of the left hemidiaphragm, stable. -ABG on room air reveals pH of 7.38, PCO2 of 51, and PO2 of 84. PCO2 is likely at baseline. -Creatinine is 2.55. Patient has a history of CK D with an unknown baseline. Creatinine was 2.28 at [facility] on 1/25. -Glucose is 160-201.   1. Acute encephalopathy. Etiology is unclear, but could likely be from polypharmacy or unintentional  misuse of benzodiazepines, opiates coupled with other psychotropic medications. -Patient's chronic psychotropic medications include Xanax 3 times a day as needed, amitriptyline 150 mg daily at bedtime, gabapentin 300 mg 3 times a day, Percocet 10/325 every 4 hours as needed, and Paxil 40 mg daily. -These medications are on hold. -We'll order MRI of the brain and vitamin B12 level.  2. Pulmonary edema.  Patient has a history of CHF, diastolic or systolic dysfunction is unknown. He takes Lasix 40 mg daily. -We'll continue less than maintenance IV fluids and give Lasix 20 g IV daily. Will order 2-D echo.  3. Insulin requiring diabetes mellitus with neuropathy. Patient is treated with Lantus 70 units twice a day. He is treated with amitriptyline and gabapentin. -We'll continue sliding scale NovoLog and add 5 units Lantus twice a day.  4. COPD with mild hypercapnia-likely chronic. Patient is treated with albuterol inhaler 3 times a day and Atrovent nebulizer as needed. -We'll start DuoNeb tid.  5. Depression with anxiety. Patient is treated with Xanax, amitriptyline, and Paxil. -Continue to hold these medications.  6. Questionable UTI; diagnosed 1/25 at [facility]. Patient was discharged on Cipro. His urinalysis here is unremarkable. -We'll hold Cipro pending the urine culture.  7. 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 Patient's baseline creatinine is unknown. It was 2.55 on admission. 2.28 at [facility] on 1/25.

## 2016-10-20 NOTE — ED Notes (Signed)
Pt states his chest is hurting, pt seems to be drifting off to sleep & only mumbling when speaks.

## 2016-10-20 NOTE — H&P (Signed)
History and Physical    Javier Craw. SRP:[id number] DOB: [birth year] DOA: 10/20/2016  PCP: Deloria Lair, MD  Patient coming from: home  Chief Complaint:  confusion  HPI: Javier Craw. is a 61 y.o. male with medical history significant of DM, recent [facility] hospitalization for UTI, opiate use brought in by family for confusion.  Pt was just discharged from [facility] with uti.  He goes there a lot.  All history obtained from EDP. Per neighbor pt has not "been right" in weeks.  He lives with wife.  No report of fever.  No n/v/d.  Does not take extra pain pills that they know of.  Report of CKD but unknown how bad.  Pt work up in ED shows cr of 2.5 and he is sedated but responds to voice.  Referred for admission for encephalopathy of unclear etiology.  Narcan not given in ED.  uds pending.  Review of Systems: unobtainable due to ams  Past Medical History:  Diagnosis Date  . Chronic headaches   . Depressive disorder, not elsewhere classified   . GERD (gastroesophageal reflux disease)   . HBP (high blood pressure)   . Other diseases of lung, not elsewhere classified   . Shortness of breath   . Steatohepatitis   . Type II or unspecified type diabetes mellitus without mention of complication, uncontrolled   . Unspecified essential hypertension     Past Surgical History:  Procedure Laterality Date  . circumcision for Chronic Balanitis  2009  . cystoscopic left Ureteral stone removal  1996  . LUMBAR LAMINECTOMY  1976     reports that he has never smoked. He does not have any smokeless tobacco history on file. He reports that he does not drink alcohol or use drugs.  Allergies  Allergen Reactions  . Meperidine Other (See Comments)    "makes me crazy"    No family history on file.  Unknown due to ams  Prior to Admission medications   Medication Sig Start Date End Date Taking? Authorizing Provider  albuterol (PROVENTIL,VENTOLIN) 90 MCG/ACT inhaler Inhale 2 puffs into the  lungs every 6 (six) hours as needed.      Historical Provider, MD  ALPRAZolam Duanne Moron) 1 MG tablet Take 1 mg by mouth 3 (three) times daily as needed.      Historical Provider, MD  amitriptyline (ELAVIL) 50 MG tablet Take 100 mg by mouth at bedtime.      Historical Provider, MD  aspirin 81 MG EC tablet Take 81 mg by mouth daily.      Historical Provider, MD  B Complex Vitamins (B-COMPLEX/B-12 PO) Take 2,500 capsules by mouth daily.    Historical Provider, MD  ciprofloxacin (CIPRO) 500 MG tablet Take 500 mg by mouth 2 (two) times daily. 11/17/14   Historical Provider, MD  esomeprazole (NEXIUM) 20 MG capsule Take 20 mg by mouth daily before breakfast.      Historical Provider, MD  fluticasone (VERAMYST) 27.5 MCG/SPRAY nasal spray Place 1 spray into the nose daily.      Historical Provider, MD  Fluticasone-Salmeterol (ADVAIR DISKUS) 500-50 MCG/DOSE AEPB Inhale 1 puff into the lungs every 12 (twelve) hours.      Historical Provider, MD  gabapentin (NEURONTIN) 300 MG capsule Take 600 mg by mouth 3 (three) times daily.      Historical Provider, MD  insulin glargine (LANTUS) 100 UNIT/ML injection Inject 70 Units into the skin 2 (two) times daily.      Historical Provider, MD  lubiprostone (AMITIZA) 24 MCG capsule Take 1 capsule (24 mcg total) by mouth 2 (two) times daily with a meal. Patient not taking: Reported on 04/08/2015 01/07/15   Carlis Stable, NP  metoCLOPramide (REGLAN) 10 MG tablet Take 10 mg by mouth 4 (four) times daily. Before meals and at bedtime     Historical Provider, MD  oxyCODONE-acetaminophen (PERCOCET) 10-325 MG per tablet 1 tablet every 4 (four) hours as needed for pain.  12/04/14   Historical Provider, MD  PARoxetine (PAXIL) 40 MG tablet Take 40 mg by mouth every morning.      Historical Provider, MD  tamsulosin (FLOMAX) 0.4 MG CAPS capsule Take 0.4 mg by mouth daily after supper.    Historical Provider, MD    Physical Exam: Vitals:   10/20/16 0300 10/20/16 0321 10/20/16 0400 10/20/16  0430  BP: 121/81  143/74 154/89  Pulse: 91  98 103  Resp:   18 19  Temp:      TempSrc:      SpO2: 98% 97% 95% 96%  Weight:      Height:        Constitutional: NAD, calm, comfortable, appears sedated and sleepy Vitals:   10/20/16 0300 10/20/16 0321 10/20/16 0400 10/20/16 0430  BP: 121/81  143/74 154/89  Pulse: 91  98 103  Resp:   18 19  Temp:      TempSrc:      SpO2: 98% 97% 95% 96%  Weight:      Height:       Eyes: PERRL, lids and conjunctivae normal, good color , responds to voice ENMT: Mucous membranes are dry. Posterior pharynx clear of any exudate or lesions.Normal dentition.  Neck: normal, supple, no masses, no thyromegaly Respiratory: clear to auscultation bilaterally, no wheezing, no crackles. Normal respiratory effort. No accessory muscle use.  Cardiovascular: Regular rate and rhythm, no murmurs / rubs / gallops. No extremity edema. 2+ pedal pulses. No carotid bruits.  Abdomen: no tenderness, no masses palpated. No hepatosplenomegaly. Bowel sounds positive.  Musculoskeletal: no clubbing / cyanosis. No joint deformity upper and lower extremities. Good ROM, no contractures. Normal muscle tone.  Skin: no rashes, lesions, ulcers. No induration Neurologic: CN 2-12 grossly intact. Sensation intact, DTR normal. Strength 5/5 in all 4.  Psychiatric: somnulent  Labs on Admission: I have personally reviewed following labs and imaging studies  CBC:  Recent Labs Lab 10/20/16 0126  WBC 7.9  HGB 11.6*  HCT 35.2*  MCV 91.4  PLT 681   Basic Metabolic Panel:  Recent Labs Lab 10/20/16 0126  NA 134*  K 3.8  CL 94*  CO2 31  GLUCOSE 148*  BUN 29*  CREATININE 2.55*  CALCIUM 8.7*   GFR: Estimated Creatinine Clearance: 43.2 mL/min (by C-G formula based on SCr of 2.55 mg/dL (H)). Liver Function Tests:  Recent Labs Lab 10/20/16 0126  AST 23  ALT 18  ALKPHOS 65  BILITOT 0.7  PROT 8.0  ALBUMIN 3.5    Cardiac Enzymes:  Recent Labs Lab 10/20/16 0132    TROPONINI <0.03    CBG:  Recent Labs Lab 10/20/16 0142  GLUCAP 160*    Urine analysis:    Component Value Date/Time   COLORURINE YELLOW 10/20/2016 0234   APPEARANCEUR CLEAR 10/20/2016 0234   LABSPEC 1.012 10/20/2016 0234   PHURINE 5.0 10/20/2016 0234   GLUCOSEU 50 (A) 10/20/2016 0234   HGBUR SMALL (A) 10/20/2016 0234   BILIRUBINUR NEGATIVE 10/20/2016 0234   KETONESUR NEGATIVE 10/20/2016 0234   PROTEINUR  NEGATIVE 10/20/2016 0234   NITRITE NEGATIVE 10/20/2016 0234   LEUKOCYTESUR NEGATIVE 10/20/2016 0234     Radiological Exams on Admission: Ct Head Wo Contrast  Result Date: 10/20/2016 CLINICAL DATA:  61 year old male with cough and altered mental status. EXAM: CT HEAD WITHOUT CONTRAST TECHNIQUE: Contiguous axial images were obtained from the base of the skull through the vertex without intravenous contrast. COMPARISON:  Head CT dated 10/10/2016 FINDINGS: Brain: There is mild age-related atrophy and chronic microvascular ischemic changes. There is no acute intracranial hemorrhage. No mass effect or midline shift noted. No extra-axial fluid collection. Vascular: No hyperdense vessel or unexpected calcification. Skull: Normal. Negative for fracture or focal lesion. Sinuses/Orbits: No acute finding. 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 IMPRESSION: No acute intracranial pathology. Electronically Signed   By: Anner Crete M.D.   On: 10/20/2016 03:36   Dg Chest Portable 1 View  Result Date: 10/20/2016 CLINICAL DATA:  Initial evaluation for acute shortness of breath, altered mental status. EXAM: PORTABLE CHEST 1 VIEW COMPARISON:  Prior radiograph from 10/19/2016. FINDINGS: Moderate cardiomegaly, stable from previous. Mediastinal silhouette unchanged. Chronic elevation of the left hemidiaphragm. Interval increase in pulmonary vascular congestion with interstitial prominence, compatible with progressive pulmonary edema. Possible small left pleural effusion. No  definite focal infiltrates. No pneumothorax. Osseous structures unchanged. IMPRESSION: 1. Stable cardiomegaly with interval development of pulmonary edema. Possible small left pleural effusion. 2. Chronic elevation of the left hemidiaphragm, stable. Electronically Signed   By: Jeannine Boga M.D.   On: 10/20/2016 02:11    EKG: Independently reviewed. nsr no acute issues cxr reviewed no infiltrate mild edeam noted Case discussed with edp  Assessment/Plan 61 yo male with somnulence /encephalopathy of unclear etiology  Principal Problem:   Encephalopathy acute- no source of infection.  Responds to voice and arouses.  abg with normal co2 levels.  Obtain records from [facility].  Check ammonia level.  Check tsh level.  Ct head neg.  No focal neuro def.  Query if this is due to medications.  Hold all meds, if does not arouse in the next 10 hours or so consider further neuro work up   Active Problems:   COPD (chronic obstructive pulmonary disease) ([place])- lungs clear, abg good   Renal disease- unknown baseline cr.  Up at 2.5.  Obtain [facility] records   Chronic use of opiate for therapeutic purpose- noted, uds pending   DM (diabetes mellitus) ([place])- ssi, not acidotic    DVT prophylaxis: scds  Code Status:  full Family Communication:  none  Disposition Plan:  Per day team Consults called:  none Admission status:  observation   Leona Pressly A MD [facility]  If 7PM-7AM, please contact night-coverage [email or web address] Password [email or web address]  10/20/2016, 5:25 AM

## 2016-10-20 NOTE — ED Notes (Addendum)
Given sternal rub and a little more alert. Pt asked what time it was and where is breakfast was.pt oriented and updated. Pt able to tell me it is January 2018. Thought he was at [facility]. Hosp. And speech therapy aware. Dr Caryn Section stated she was going to put in orders but to hold All PO meds until evaluated by ST

## 2016-10-20 NOTE — Progress Notes (Signed)
*  PRELIMINARY RESULTS* Echocardiogram 2D Echocardiogram has been performed.  Leavy Cella 10/20/2016, 11:57 AM

## 2016-10-20 NOTE — Therapy (Signed)
Speech Language Report- Bedside Swallowing Evaluation   Past Medical History : Keylin Podolsky. is a 61 y.o. male with medical history significant of DM, recent [facility] hospitalization for UTI, opiate use brought in by family for confusion.  Pt was just discharged from [facility] with uti.  He goes there a lot.  All history obtained from EDP. Per neighbor pt has not "been right" in weeks.  He lives with wife.  No report of fever.  No n/v/d.  Does not take extra pain pills that they know of.  Report of CKD but unknown how bad.  Pt work up in ED shows cr of 2.5 and he is sedated but responds to voice.  Referred for admission for encephalopathy of unclear etiology.  Narcan not given in ED.  uds pending. BSE ordered to determine least restrictive diet.  S: Pt was was lethargic but speaking at times during evaluation (repeating the same questions), he required initial sternal rubs from nursing to improve alertness O: He consumed ice chips via SLP controlled spoon portions, no s/sx of aspiration noted however suspecting delayed swallow, he consumed water via spoon with no s/sx of aspiration -again suspecting delayed swallow, he consumed applesauce with SLP controlled spoon portions with no difficulty -cleared residue from oral cavity. Mod vebal and tactile cues provided across trials to open oral cavity to accept solids and liquids, lingual movements were slow likely d/t lethargy. Unable to assess cough and throat clear secondary to decreased direction following. Pt. Is edentulous-unsure if he has dentures (unable to locate in room). Pt unable to attend to cup sips today, advanced diet trials (graham crackers) were not attempted secondary to level of alertness and decreased direction following.  A: Pt presents with moderate risk of aspiration secondary to level of consciousness and suspected swallowing delay P: Recommended diet: dysphagia 1/thin liquids with spoon ONLY, oral care before and after po intake, feed  ONLY when awake, ice chips ok, crush medication in puree, sit upright with po intake, small bites/sips, requires full assist and supervision with feeding. ST will follow to ensure diet tolerance and attempt advanced diet trials as deemed appropriate.   Thank you,   Renato Gails. Megan Salon, MS, CCC-SLP  Speech-Language Pathologist [phone]

## 2016-10-20 NOTE — ED Notes (Signed)
Dr Caryn Section aware of pt

## 2016-10-20 NOTE — Procedures (Signed)
[place] A. Merlene Laughter, MD     [email or web address]           HISTORY: This is a 61 year old man who presents with confusion and altered mental status. The studies being done to evaluate for seizures at the etiology of the confusion.  MEDICATIONS: Scheduled Meds: . aspirin  81 mg Oral Daily  . furosemide  20 mg Intravenous Daily  . gabapentin  100 mg Oral TID  . insulin aspart  0-9 Units Subcutaneous Q4H  . insulin glargine  5 Units Subcutaneous BID  . ipratropium-albuterol  3 mL Nebulization Q8H  . mometasone-formoterol  2 puff Inhalation BID  . pantoprazole  40 mg Oral Daily  . PARoxetine  40 mg Oral Daily  . sodium chloride flush  3 mL Intravenous Q12H  . tamsulosin  0.4 mg Oral QHS   Continuous Infusions: . 0.9 % NaCl with KCl 20 mEq / L 70 mL/hr at 10/20/16 0923   PRN Meds:.sodium chloride, sodium chloride flush  Prior to Admission medications   Medication Sig Start Date End Date Taking? Authorizing Provider  ALPRAZolam Duanne Moron) 1 MG tablet Take 1 mg by mouth 3 (three) times daily as needed for anxiety.    Yes Historical Provider, MD  aspirin 81 MG chewable tablet Chew 81 mg by mouth daily.   Yes Historical Provider, MD  BREO ELLIPTA 200-25 MCG/INH AEPB Inhale 1 puff into the lungs daily. 08/31/16  Yes Historical Provider, MD  ciprofloxacin (CIPRO) 500 MG tablet Take 500 mg by mouth 2 (two) times daily. 11/17/14  Yes Historical Provider, MD  INCRUSE ELLIPTA 62.5 MCG/INH AEPB Inhale 1 puff into the lungs daily. 08/31/16  Yes Historical Provider, MD  oxyCODONE-acetaminophen (PERCOCET) 10-325 MG per tablet 1 tablet every 4 (four) hours as needed for pain.  12/04/14  Yes Historical Provider, MD  tamsulosin (FLOMAX) 0.4 MG CAPS capsule Take 0.4 mg by mouth 2 (two) times daily.    Yes Historical Provider, MD  albuterol (PROVENTIL,VENTOLIN) 90 MCG/ACT inhaler Inhale 2 puffs into the lungs every 6 (six) hours as needed.      Historical Provider, MD  amitriptyline  (ELAVIL) 50 MG tablet Take 100 mg by mouth at bedtime.      Historical Provider, MD  B Complex Vitamins (B-COMPLEX/B-12 PO) Take 2,500 capsules by mouth daily.    Historical Provider, MD  esomeprazole (NEXIUM) 20 MG capsule Take 20 mg by mouth daily before breakfast.      Historical Provider, MD  fluticasone (VERAMYST) 27.5 MCG/SPRAY nasal spray Place 1 spray into the nose daily.      Historical Provider, MD  Fluticasone-Salmeterol (ADVAIR DISKUS) 500-50 MCG/DOSE AEPB Inhale 1 puff into the lungs every 12 (twelve) hours.      Historical Provider, MD  furosemide (LASIX) 40 MG tablet Take 1 tablet by mouth daily. 10/06/16   Historical Provider, MD  gabapentin (NEURONTIN) 300 MG capsule Take 600 mg by mouth 3 (three) times daily.      Historical Provider, MD  insulin glargine (LANTUS) 100 UNIT/ML injection Inject 70 Units into the skin 2 (two) times daily.      Historical Provider, MD  lubiprostone (AMITIZA) 24 MCG capsule Take 1 capsule (24 mcg total) by mouth 2 (two) times daily with a meal. Patient not taking: Reported on 04/08/2015 01/07/15   Carlis Stable, NP  metoCLOPramide (REGLAN) 10 MG tablet Take 10 mg by mouth 4 (four) times daily. Before meals and at bedtime     Historical Provider, MD  PARoxetine (PAXIL) 40 MG tablet Take 40 mg by mouth every morning.      Historical Provider, MD      ANALYSIS: A 16 channel recording using standard 10 20 measurements is conducted for 21 minutes. The background activity get as high as 7-1/2-8 Hz. There is beta activity observed in the frontal areas. Awake and sleep architecture are observed. K complexes and sleep spindles are observed. Photic simulation and hyperventilation are not conducted. There is no focal or lateral slowing. There is no epileptiform activities observed.    IMPRESSION: This recording shows mild global slowing indicating a mild global encephalopathy. However, no epileptiform activities are observed.      Kahle Mcqueen A. Merlene Laughter, M.D.    Diplomate, Tax adviser of Psychiatry and Neurology ( Neurology).

## 2016-10-20 NOTE — Progress Notes (Signed)
Offsite EEG completed at [facility].  Results pending.

## 2016-10-20 NOTE — ED Notes (Signed)
Echo tech in with pt at this time

## 2016-10-20 NOTE — Therapy (Signed)
Cancellation note: Attempted to see patient around 8:50 this morning for BSE; however, he was too lethargic for po trials (after several attempts by SLP to increase alertness). Followed up with nsg about this. Plan to see patient later in the day.   Thank you,   Renato Gails. Megan Salon, MS, CCC-SLP  Speech-Language Pathologist [phone]

## 2016-10-20 NOTE — ED Notes (Signed)
Floor RN states will call back in about 20 min for report. No change in status.

## 2016-10-20 NOTE — ED Notes (Signed)
Family member in the room, states pt was seen yesterday in [place] for chest pain & SOB. Was sent home. Family says pt continued to get weak & started talking out of his head. Had an episode similar a few weeks ago that was due to a UTI. Pt was on antibiotics for the UTI and has completed the course of medicine.

## 2016-10-20 NOTE — ED Triage Notes (Signed)
Pt arrived by EMS from home. Called out for SOB and AMS. Pt was seen at [facility] today for CP.

## 2016-10-21 DIAGNOSIS — J9611 Chronic respiratory failure with hypoxia: Secondary | ICD-10-CM | POA: Diagnosis present

## 2016-10-21 DIAGNOSIS — G894 Chronic pain syndrome: Secondary | ICD-10-CM | POA: Diagnosis present

## 2016-10-21 DIAGNOSIS — Z79891 Long term (current) use of opiate analgesic: Secondary | ICD-10-CM

## 2016-10-21 DIAGNOSIS — F418 Other specified anxiety disorders: Secondary | ICD-10-CM | POA: Diagnosis present

## 2016-10-21 DIAGNOSIS — Z79899 Other long term (current) drug therapy: Secondary | ICD-10-CM

## 2016-10-21 DIAGNOSIS — G9340 Encephalopathy, unspecified: Secondary | ICD-10-CM

## 2016-10-21 DIAGNOSIS — N183 Chronic kidney disease, stage 3 unspecified: Secondary | ICD-10-CM | POA: Diagnosis present

## 2016-10-21 DIAGNOSIS — E1121 Type 2 diabetes mellitus with diabetic nephropathy: Secondary | ICD-10-CM

## 2016-10-21 LAB — GLUCOSE, CAPILLARY
GLUCOSE-CAPILLARY: 166 mg/dL — AB (ref 65–99)
GLUCOSE-CAPILLARY: 69 mg/dL (ref 65–99)
Glucose-Capillary: 130 mg/dL — ABNORMAL HIGH (ref 65–99)
Glucose-Capillary: 272 mg/dL — ABNORMAL HIGH (ref 65–99)
Glucose-Capillary: 157 mg/dL — ABNORMAL HIGH (ref 65–99)

## 2016-10-21 LAB — CBC
HCT: 33.5 % — ABNORMAL LOW (ref 39.0–52.0)
Hemoglobin: 10.8 g/dL — ABNORMAL LOW (ref 13.0–17.0)
MCH: 29.7 pg (ref 26.0–34.0)
MCHC: 32.2 g/dL (ref 30.0–36.0)
MCV: 92 fL (ref 78.0–100.0)
PLATELETS: 228 10*3/uL (ref 150–400)
RBC: 3.64 MIL/uL — ABNORMAL LOW (ref 4.22–5.81)
RDW: 13 % (ref 11.5–15.5)
WBC: 6.1 10*3/uL (ref 4.0–10.5)

## 2016-10-21 LAB — COMPREHENSIVE METABOLIC PANEL
ALT: 16 U/L — ABNORMAL LOW (ref 17–63)
ANION GAP: 9 (ref 5–15)
AST: 22 U/L (ref 15–41)
Albumin: 3.2 g/dL — ABNORMAL LOW (ref 3.5–5.0)
Alkaline Phosphatase: 59 U/L (ref 38–126)
BUN: 25 mg/dL — ABNORMAL HIGH (ref 6–20)
CHLORIDE: 98 mmol/L — AB (ref 101–111)
CO2: 30 mmol/L (ref 22–32)
Calcium: 8.2 mg/dL — ABNORMAL LOW (ref 8.9–10.3)
Creatinine, Ser: 2.29 mg/dL — ABNORMAL HIGH (ref 0.61–1.24)
GFR, EST AFRICAN AMERICAN: 33 mL/min — AB (ref >60)
GFR, EST NON AFRICAN AMERICAN: 29 mL/min — AB (ref >60)
Glucose, Bld: 183 mg/dL — ABNORMAL HIGH (ref 65–99)
POTASSIUM: 3.8 mmol/L (ref 3.5–5.1)
Sodium: 137 mmol/L (ref 135–145)
Total Bilirubin: 0.6 mg/dL (ref 0.3–1.2)
Total Protein: 7.3 g/dL (ref 6.5–8.1)

## 2016-10-21 LAB — URINE CULTURE: CULTURE: NO GROWTH

## 2016-10-21 MED ORDER — AMITRIPTYLINE HCL 50 MG PO TABS: 50.0000 mg | ORAL_TABLET | Freq: Every day | ORAL | Status: AC

## 2016-10-21 MED ORDER — ALPRAZOLAM 1 MG PO TABS: 0.5000 mg | ORAL_TABLET | Freq: Two times a day (BID) | ORAL | Status: AC | PRN

## 2016-10-21 MED ORDER — INSULIN GLARGINE 100 UNIT/ML ~~LOC~~ SOLN: 50.0000 [IU] | Freq: Three times a day (TID) | SUBCUTANEOUS | Status: AC

## 2016-10-21 MED ORDER — OXYCODONE-ACETAMINOPHEN 10-325 MG PO TABS: 0.5000 | ORAL_TABLET | ORAL | Status: AC | PRN

## 2016-10-21 MED ORDER — METOCLOPRAMIDE HCL 10 MG PO TABS: 10.0000 mg | ORAL_TABLET | Freq: Two times a day (BID) | ORAL | Status: AC

## 2016-10-21 NOTE — Discharge Summary (Signed)
Physician Discharge Summary  Javier Ho. QVZ:[id number] DOB: 08/19/1956 DOA: 10/20/2016  PCP: Deloria Lair, MD  Admit date: 10/20/2016 Discharge date: 10/21/2016  Time spent:Greater than 30  minutes  Recommendations for Outpatient Follow-up:  1. Recommend close monitoring of the patient's psychotropic medications and adjusting doses as needed.     Discharge Diagnoses:  Principal Problem:   Encephalopathy acute Active Problems:   COPD (chronic obstructive pulmonary disease) (HCC)   Renal disease   Chronic use of opiate for therapeutic purpose   Diabetes mellitus with nephropathy (HCC)   Morbid obesity (HCC)   Chronic respiratory failure with hypoxia (HCC)   Chronic prescription benzodiazepine use   Chronic pain syndrome   Depression with anxiety   CKD (chronic kidney disease), stage III   Discharge Condition: Improved.  Diet recommendation: Carbohydrate modified/heart healthy.  Filed Weights   10/20/16 0110 10/21/16 0445  Weight: 130.6 kg (288 lb) 127 kg (279 lb 15.8 oz)    History of present illness:  Patient is a 61 year old man with a history of COPD, CKD, CHF, diabetes mellitus, depression/anxiety, chronic pain syndrome/chronic headaches, morbid obesity, who was admitted for altered mental status. Patient was just discharged from the [facility] ED on 10/19/16 for AMS, UTI, and chest pain. He was admitted for further evaluation and management.  Hospital Course:  Acute encephalopathy, likely secondary to medication side effects.  In the ED, the patient was afebrile and hemodynamically stable. His glucose was not low. CT of his head revealed no acute changes. His chest x-ray revealed no obvious infiltrates. His urinalysis was unremarkable. His ABG revealed mild compensated hypercapnia. His urine drug screen was positive for benzodiazepines but had been negative for benzodiazepines at [facility] was positive for opiates at that time-per review of the  records from [place]. -Patient's chronic psychotropic medications include Xanax 3 times a day as needed, amitriptyline 150 mg daily at bedtime, gabapentin 300 mg 3 times a day, Percocet 10/325 every 4 hours as needed, and Paxil 40 mg daily. All were withheld on admission. -He was started on supportive treatment with maintenance IV fluids and neuro checks. -For further evaluation, a number of studies were ordered. His EEG revealed mild global encephalopathy, but no epileptiform activity. MRI of the brain revealed no acute findings. TSH was within normal limits. His vitamin B12 level was not deficient. Ammonia level was virtually within normal limits. -The patient finally became alert and oriented. When he was questioned about what happened, he does not recall. He reports not misusing or abusing any of his chronic medications. -Although the patient reported not misusing or abusing his medications, he may have inadvertently taking too many Xanax in the setting of chronic kidney disease, dehydration, and in the setting of other psychotropic medications. -Prior to discharge, the dosing of amitriptyline was decreased, Percocet was decreased, Xanax was decreased, and Reglan was decreased. He was instructed to discuss further medication management with his PCP Dr. Scotty Court. He voiced understanding. He ambulated in the hallway unassisted prior to discharge.    Pulmonary edema secondary to mild decompensated diastolic heart failure.  Patient has a history of CHF, diastolic or systolic dysfunction was unknown on admission.Marland Kitchen He takes Lasix 40 mg daily. -He was initially started on vigorous IV fluids, but the rate was discontinued. While he was encephalopathic, IV Lasix was given. -2-D echocardiogram ordered and revealed an EF of 55-60% and grade 1 diastolic dysfunction. -The patient was instructed to restart Lasix at the time of discharge.  Insulin requiring  diabetes mellitus with neuropathy.  Patient is  treated with Lantus 70 units 3 times daily.Marland Kitchen He is also treated with amitriptyline and gabapentin. -The dose of Lantus was decreased and sliding scale NovoLog was started. Amitriptyline and gabapentin was initially held. -His CBGs were reasonable. -At the time of discharge, the dose of Lantus was decreased to 50 units 3 times a day. Hemoglobin A1c was pending at discharge.  COPD with chronic hypoxic and hypercapnic respiratory failure. Patient is treated with inhalers and nebulizers.Marland Kitchen He is also on 3 L of nasal cannula oxygen. -He was started on DuoNeb 3 times a day and oxygen. His COPD remained stable and his mild hypercapnia appeared compensated based on the normal pH.  Chronic pain syndrome, secondary to chronic low back pain and headaches. The patient is treated with Percocet 10 mg every 4 hours as needed. He was instructed to decrease the dose to 5 mg every 4 hours as needed in the setting of acute encephalopathy.   Depression with anxiety.  Patient is treated with Xanax, amitriptyline, and Paxil. -These medications were withheld on admission. At the time of discharge, he was instructed to decrease the dose of Xanax and amitriptyline as instructed. Paxil was restarted without change.   Questionable UTI  Patient was apparently diagnosed with UTI on 1/25 at [facility]. Patient was discharged on Cipro. His urinalysis here was unremarkable, therefore Cipro was not given. He had no complaints of pain with urination. He was afebrile and did not have leukocytosis.   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  Patient's baseline creatinine was  unknown. It was 2.55 on admission. 2.28 at [facility] on 1/25. It improved to 29 with gentle hydration.  -Of note, the patient may have less clearance of his psychotropic medications which could've led to buildup in his blood and encephalopathic symptoms. This was explained to the patient.  -He is followed by Dr. Lowanda Foster in the outpatient  setting. He was instructed to follow-up with Dr. Lowanda Foster as scheduled.     Procedures: -EEG 10/20/16: Mild global slowing indicating mild global encephalopathy; no epileptiform activities observed. -2-D echocardiogram 10/20/16: Study Conclusions - Left ventricle: The cavity size was normal. Wall thickness was   normal. Systolic function was normal. The estimated ejection   fraction was in the range of 55% to 60%. Doppler parameters are   consistent with abnormal left ventricular relaxation (grade 1   diastolic dysfunction). - Aortic valve: Valve area (Vmax): 2.56 cm^2. - Left atrium: The atrium was moderately dilated.- Technically difficult study.  Consultations:  None  Discharge Exam: Vitals:   10/20/16 2132 10/21/16 0445  BP: 130/61 136/75  Pulse: 89 83  Resp: 18 18  Temp: 97.9 F (36.6 C) 97.8 F (36.6 C)    General: 61 year old obese Caucasian man sitting up in bed, much more alert and oriented. Cardiovascular: S1, S2, with a soft systolic murmur. Respiratory: Clear to auscultation bilaterally. Neuropsychiatric: He is alert. His speech is clear. Cranial nerves II through XII are intact. He does not endorse abusing his medications. He was witnessed to ambulate in the hallway without difficulty.  Discharge Instructions   Discharge Instructions    Diet - low sodium heart healthy    Complete by:  As directed    Discharge instructions    Complete by:  As directed    THE DOSES OF SOME OF YOUR MEDICATIONS HAVE BEEN REDUCED TO AVOID FUTURE CONFUSION. YOU WILL NEED TO DISCUSS YOUR MEDICATIONS FURTHER WITH YOUR PRIMARY PHYSICIAN. DO  NOT DRIVE.   Driving Restrictions    Complete by:  As directed    DO NOT DRIVE   Increase activity slowly    Complete by:  As directed      Discharge Medication List as of 10/21/2016 12:49 PM    CONTINUE these medications which have CHANGED   Details  ALPRAZolam (XANAX) 1 MG tablet Take 0.5 tablets (0.5 mg total) by mouth 2 (two) times  daily as needed for anxiety., Starting Sat 10/21/2016, No Print    amitriptyline (ELAVIL) 50 MG tablet Take 1 tablet (50 mg total) by mouth at bedtime., Starting Sat 10/21/2016, No Print    insulin glargine (LANTUS) 100 UNIT/ML injection Inject 0.5 mLs (50 Units total) into the skin 3 (three) times daily. DO NOT TAKE INSULIN IF YOUR BLOOD SUGAR IS LESS THAN 120., Starting Sat 10/21/2016, No Print    metoCLOPramide (REGLAN) 10 MG tablet Take 1 tablet (10 mg total) by mouth 2 (two) times daily. Before meals and at bedtime, Starting Sat 10/21/2016, No Print    oxyCODONE-acetaminophen (PERCOCET) 10-325 MG tablet Take 0.5 tablets by mouth every 4 (four) hours as needed for pain., Starting Sat 10/21/2016, No Print      CONTINUE these medications which have NOT CHANGED   Details  albuterol (PROVENTIL,VENTOLIN) 90 MCG/ACT inhaler Inhale 2 puffs into the lungs every 6 (six) hours as needed for wheezing or shortness of breath. , Historical Med    aspirin 81 MG chewable tablet Chew 81 mg by mouth daily., Historical Med    BREO ELLIPTA 200-25 MCG/INH AEPB Inhale 1 puff into the lungs daily., Starting Thu 08/31/2016, Historical Med    esomeprazole (NEXIUM) 20 MG capsule Take 20 mg by mouth daily before breakfast.  , Historical Med    furosemide (LASIX) 40 MG tablet Take 1 tablet by mouth daily., Starting Fri 10/06/2016, Historical Med    gabapentin (NEURONTIN) 300 MG capsule Take 600 mg by mouth at bedtime. , Historical Med    INCRUSE ELLIPTA 62.5 MCG/INH AEPB Inhale 1 puff into the lungs daily., Starting Thu 08/31/2016, Historical Med    PARoxetine (PAXIL) 40 MG tablet Take 40 mg by mouth every morning.  , Historical Med    tamsulosin (FLOMAX) 0.4 MG CAPS capsule Take 0.4 mg by mouth 2 (two) times daily. , Historical Med    B Complex Vitamins (B-COMPLEX/B-12 PO) Take 2,500 capsules by mouth daily., Historical Med      STOP taking these medications     aspirin 81 MG EC tablet        Allergies   Allergen Reactions  . Meperidine Other (See Comments)    "makes me crazy"      The results of significant diagnostics from this hospitalization (including imaging, microbiology, ancillary and laboratory) are listed below for reference.    Significant Diagnostic Studies: Ct Head Wo Contrast  Result Date: 10/20/2016 CLINICAL DATA:  61 year old male with cough and altered mental status. EXAM: CT HEAD WITHOUT CONTRAST TECHNIQUE: Contiguous axial images were obtained from the base of the skull through the vertex without intravenous contrast. COMPARISON:  Head CT dated 10/10/2016 FINDINGS: Brain: There is mild age-related atrophy and chronic microvascular ischemic changes. There is no acute intracranial hemorrhage. No mass effect or midline shift noted. No extra-axial fluid collection. Vascular: No hyperdense vessel or unexpected calcification. Skull: Normal. Negative for fracture or focal lesion. Sinuses/Orbits: No acute finding. 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 IMPRESSION: No acute intracranial pathology. Electronically Signed  By: Anner Crete M.D.   On: 10/20/2016 03:36   Mr Brain Wo Contrast  Result Date: 10/20/2016 CLINICAL DATA:  Acute encephalopathy EXAM: MRI HEAD WITHOUT CONTRAST TECHNIQUE: Multiplanar, multiecho pulse sequences of the brain and surrounding structures were obtained without intravenous contrast. COMPARISON:  CT head 10/20/2016 FINDINGS: Brain: Image quality degraded by mild motion. Mild atrophy. Negative for hydrocephalus. Negative for acute infarct. No significant chronic ischemia. Several scattered tiny white matter hyperintensities. Negative for hemorrhage or mass. No shift of the midline structures. Vascular: Normal arterial flow voids. 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 No skeletal abnormality in this skull. Sinuses/Orbits: Negative Other: None IMPRESSION: Mild atrophy.   No acute abnormality. Electronically Signed   By: Franchot Gallo M.D.   On: 10/20/2016 10:18   Dg Chest Portable 1 View  Result Date: 10/20/2016 CLINICAL DATA:  Initial evaluation for acute shortness of breath, altered mental status. EXAM: PORTABLE CHEST 1 VIEW COMPARISON:  Prior radiograph from 10/19/2016. FINDINGS: Moderate cardiomegaly, stable from previous. Mediastinal silhouette unchanged. Chronic elevation of the left hemidiaphragm. Interval increase in pulmonary vascular congestion with interstitial prominence, compatible with progressive pulmonary edema. Possible small left pleural effusion. No definite focal infiltrates. No pneumothorax. Osseous structures unchanged. IMPRESSION: 1. Stable cardiomegaly with interval development of pulmonary edema. Possible small left pleural effusion. 2. Chronic elevation of the left hemidiaphragm, stable. Electronically Signed   By: Jeannine Boga M.D.   On: 10/20/2016 02:11    Microbiology: Recent Results (from the past 240 hour(s))  Urine culture     Status: None   Collection Time: 10/20/16  2:35 AM  Result Value Ref Range Status   Specimen Description URINE, CLEAN CATCH  Final   Special Requests NONE  Final   Culture   Final    NO GROWTH Performed at [facility], [address] [place], [place] [address]    Report Status 10/21/2016 FINAL  Final     Labs: Basic Metabolic Panel:  Recent Labs Lab 10/20/16 0126 10/21/16 0657  NA 134* 137  K 3.8 3.8  CL 94* 98*  CO2 31 30  GLUCOSE 148* 183*  BUN 29* 25*  CREATININE 2.55* 2.29*  CALCIUM 8.7* 8.2*   Liver Function Tests:  Recent Labs Lab 10/20/16 0126 10/21/16 0657  AST 23 22  ALT 18 16*  ALKPHOS 65 59  BILITOT 0.7 0.6  PROT 8.0 7.3  ALBUMIN 3.5 3.2*   No results for input(s): LIPASE, AMYLASE in the last 168 hours.  Recent Labs Lab 10/20/16 0523  AMMONIA 36*   CBC:  Recent Labs Lab 10/20/16 0126 10/21/16 0657  WBC 7.9 6.1  HGB 11.6* 10.8*  HCT 35.2*  33.5*  MCV 91.4 92.0  PLT 214 228   Cardiac Enzymes:  Recent Labs Lab 10/20/16 0132  TROPONINI <0.03   BNP: BNP (last 3 results)  Recent Labs  10/20/16 0524  BNP 38.0    ProBNP (last 3 results) No results for input(s): PROBNP in the last 8760 hours.  CBG:  Recent Labs Lab 10/21/16 0101 10/21/16 0401 10/21/16 0447 10/21/16 0834 10/21/16 1200  GLUCAP 166* 69 130* 272* 157*       Signed:  Osmany Azer MD.  [facility] 10/21/2016, 3:00 PM

## 2016-10-21 NOTE — Progress Notes (Addendum)
Pt CBG 69, provided 15 g carbohydrate and will recheck in 15 minutes.   Pt CBG 130 after 15 minutes.

## 2016-10-22 LAB — HEMOGLOBIN A1C
HEMOGLOBIN A1C: 7.9 % — AB (ref 4.8–5.6)
Mean Plasma Glucose: 180 mg/dL

## 2016-10-23 NOTE — ED Provider Notes (Signed)
[place] DEPT Provider Note   CSN: [id number] Arrival date & time: 10/20/16  0101     History   Chief Complaint Chief Complaint  Patient presents with  . Altered Mental Status    HPI Javier Ho. is a 61 y.o. male.  HPI   61 y.o. male with medical history significant of DM, recent [place] hospitalization for UTI who comes in with cc of AMS. LEVEL 5 CAVEAT FOR ENCEPHALOPATHY. Pt's neighbor reports that pt's wife called him for help as patient was not acting normal. He reports that pt was admitted at [facility] last week. In fact  Pt was seen at [facility] today for chest pains and discharge. PT is normally alert, oriented. Neighbor reports that he has seen patient hallucinating and talking to someone. Pt is on pain meds and benzo, but it is not new.  Past Medical History:  Diagnosis Date  . Anemia   . Chest pain   . CHF (congestive heart failure) ([place])   . Chronic bronchitis ([place])   . Chronic headaches   . CKD (chronic kidney disease)   . Depressive disorder, not elsewhere classified   . GERD (gastroesophageal reflux disease)   . HBP (high blood pressure)   . Laceration of face, multiple sites   . Lesion of lung   . Morbid obesity ([place])   . Multiple rib fractures   . Other diseases of lung, not elsewhere classified   . PNA (pneumonia)   . Sepsis ([place])   . Shortness of breath   . Steatohepatitis   . Type II or unspecified type diabetes mellitus without mention of complication, uncontrolled   . Unspecified essential hypertension     Patient Active Problem List   Diagnosis Date Noted  . Morbid obesity ([place]) 10/21/2016  . Chronic respiratory failure with hypoxia ([place]) 10/21/2016  . Chronic prescription benzodiazepine use 10/21/2016  . Chronic pain syndrome 10/21/2016  . Depression with anxiety 10/21/2016  . CKD (chronic kidney disease), stage III 10/21/2016  . Encephalopathy 10/20/2016  . COPD (chronic obstructive pulmonary disease) ([place])  10/20/2016  . Renal disease 10/20/2016  . Encephalopathy acute 10/20/2016  . Chronic use of opiate for therapeutic purpose 10/20/2016  . Diabetes mellitus with nephropathy ([place]) 10/20/2016  . Constipation 12/07/2014    Past Surgical History:  Procedure Laterality Date  . circumcision for Chronic Balanitis  2009  . COLONOSCOPY WITH ESOPHAGOGASTRODUODENOSCOPY (EGD)  04/08/2014  . cystoscopic left Ureteral stone removal  1996  . LUMBAR LAMINECTOMY  1976  . small intestine endoscopy  04/08/2014       Home Medications    Prior to Admission medications   Medication Sig Start Date End Date Taking? Authorizing Provider  albuterol (PROVENTIL,VENTOLIN) 90 MCG/ACT inhaler Inhale 2 puffs into the lungs every 6 (six) hours as needed for wheezing or shortness of breath.    Yes Historical Provider, MD  aspirin 81 MG chewable tablet Chew 81 mg by mouth daily.   Yes Historical Provider, MD  BREO ELLIPTA 200-25 MCG/INH AEPB Inhale 1 puff into the lungs daily. 08/31/16  Yes Historical Provider, MD  esomeprazole (NEXIUM) 20 MG capsule Take 20 mg by mouth daily before breakfast.     Yes Historical Provider, MD  furosemide (LASIX) 40 MG tablet Take 1 tablet by mouth daily. 10/06/16  Yes Historical Provider, MD  gabapentin (NEURONTIN) 300 MG capsule Take 600 mg by mouth at bedtime.    Yes Historical Provider, MD  INCRUSE ELLIPTA 62.5 MCG/INH AEPB Inhale 1  puff into the lungs daily. 08/31/16  Yes Historical Provider, MD  PARoxetine (PAXIL) 40 MG tablet Take 40 mg by mouth every morning.     Yes Historical Provider, MD  tamsulosin (FLOMAX) 0.4 MG CAPS capsule Take 0.4 mg by mouth 2 (two) times daily.    Yes Historical Provider, MD  ALPRAZolam Duanne Moron) 1 MG tablet Take 0.5 tablets (0.5 mg total) by mouth 2 (two) times daily as needed for anxiety. 10/21/16   Rexene Alberts, MD  amitriptyline (ELAVIL) 50 MG tablet Take 1 tablet (50 mg total) by mouth at bedtime. 10/21/16   Rexene Alberts, MD  B Complex Vitamins  (B-COMPLEX/B-12 PO) Take 2,500 capsules by mouth daily.    Historical Provider, MD  insulin glargine (LANTUS) 100 UNIT/ML injection Inject 0.5 mLs (50 Units total) into the skin 3 (three) times daily. DO NOT TAKE INSULIN IF YOUR BLOOD SUGAR IS LESS THAN 120. 10/21/16   Rexene Alberts, MD  metoCLOPramide (REGLAN) 10 MG tablet Take 1 tablet (10 mg total) by mouth 2 (two) times daily. Before meals and at bedtime 10/21/16   Rexene Alberts, MD  oxyCODONE-acetaminophen (PERCOCET) 10-325 MG tablet Take 0.5 tablets by mouth every 4 (four) hours as needed for pain. 10/21/16   Rexene Alberts, MD    Family History History reviewed. No pertinent family history.  Social History Social History  Substance Use Topics  . Smoking status: Never Smoker  . Smokeless tobacco: Not on file  . Alcohol use No     Allergies   Meperidine   Review of Systems Review of Systems  ROS 10 Systems reviewed and are negative for acute change except as noted in the HPI.     Physical Exam Updated Vital Signs BP 136/75 (BP Location: Right Arm)   Pulse 83   Temp 97.8 F (36.6 C) (Oral)   Resp 18   Ht 6' 2"  (1.88 m)   Wt 279 lb 15.8 oz (127 kg)   SpO2 96%   BMI 35.95 kg/m   Physical Exam  Constitutional: He appears well-developed.  HENT:  Head: Normocephalic and atraumatic.  Eyes: Conjunctivae and EOM are normal. Pupils are equal, round, and reactive to light.  Neck: Normal range of motion. Neck supple.  Cardiovascular: Normal rate and regular rhythm.   Pulmonary/Chest: Effort normal. He has wheezes.  Abdominal: Soft. Bowel sounds are normal. He exhibits distension. There is no tenderness. There is no rebound and no guarding.  Neurological:  Somnolent. Arousable with sternal rub. Follows simple commands. Oriented to self and location. Moving all 4 extremities  Skin: Skin is warm.  Nursing note and vitals reviewed.    ED Treatments / Results  Labs (all labs ordered are listed, but only abnormal  results are displayed) Labs Reviewed  COMPREHENSIVE METABOLIC PANEL - Abnormal; Notable for the following:       Result Value   Sodium 134 (*)    Chloride 94 (*)    Glucose, Bld 148 (*)    BUN 29 (*)    Creatinine, Ser 2.55 (*)    Calcium 8.7 (*)    GFR calc non Af Amer 25 (*)    GFR calc Af Amer 29 (*)    All other components within normal limits  CBC - Abnormal; Notable for the following:    RBC 3.85 (*)    Hemoglobin 11.6 (*)    HCT 35.2 (*)    All other components within normal limits  BLOOD GAS, ARTERIAL - Abnormal; Notable for the following:  pCO2 arterial 50.7 (*)    Bicarbonate 28.1 (*)    Acid-Base Excess 4.8 (*)    Allens test (pass/fail) POSITIVE (*)    All other components within normal limits  URINALYSIS, ROUTINE W REFLEX MICROSCOPIC - Abnormal; Notable for the following:    Glucose, UA 50 (*)    Hgb urine dipstick SMALL (*)    All other components within normal limits  RAPID URINE DRUG SCREEN, HOSP PERFORMED - Abnormal; Notable for the following:    Benzodiazepines POSITIVE (*)    All other components within normal limits  AMMONIA - Abnormal; Notable for the following:    Ammonia 36 (*)    All other components within normal limits  VITAMIN B12 - Abnormal; Notable for the following:    Vitamin B-12 1,090 (*)    All other components within normal limits  GLUCOSE, CAPILLARY - Abnormal; Notable for the following:    Glucose-Capillary 140 (*)    All other components within normal limits  CBC - Abnormal; Notable for the following:    RBC 3.64 (*)    Hemoglobin 10.8 (*)    HCT 33.5 (*)    All other components within normal limits  COMPREHENSIVE METABOLIC PANEL - Abnormal; Notable for the following:    Chloride 98 (*)    Glucose, Bld 183 (*)    BUN 25 (*)    Creatinine, Ser 2.29 (*)    Calcium 8.2 (*)    Albumin 3.2 (*)    ALT 16 (*)    GFR calc non Af Amer 29 (*)    GFR calc Af Amer 33 (*)    All other components within normal limits  GLUCOSE,  CAPILLARY - Abnormal; Notable for the following:    Glucose-Capillary 215 (*)    All other components within normal limits  GLUCOSE, CAPILLARY - Abnormal; Notable for the following:    Glucose-Capillary 166 (*)    All other components within normal limits  GLUCOSE, CAPILLARY - Abnormal; Notable for the following:    Glucose-Capillary 130 (*)    All other components within normal limits  HEMOGLOBIN A1C - Abnormal; Notable for the following:    Hgb A1c MFr Bld 7.9 (*)    All other components within normal limits  GLUCOSE, CAPILLARY - Abnormal; Notable for the following:    Glucose-Capillary 272 (*)    All other components within normal limits  GLUCOSE, CAPILLARY - Abnormal; Notable for the following:    Glucose-Capillary 157 (*)    All other components within normal limits  CBG MONITORING, ED - Abnormal; Notable for the following:    Glucose-Capillary 160 (*)    All other components within normal limits  CBG MONITORING, ED - Abnormal; Notable for the following:    Glucose-Capillary 201 (*)    All other components within normal limits  CBG MONITORING, ED - Abnormal; Notable for the following:    Glucose-Capillary 166 (*)    All other components within normal limits  URINE CULTURE  TROPONIN I  BRAIN NATRIURETIC PEPTIDE  LACTIC ACID, PLASMA  TSH  GLUCOSE, CAPILLARY    EKG  EKG Interpretation  Date/Time:  Friday October 20 2016 01:27:06 EST Ventricular Rate:  94 PR Interval:    QRS Duration: 117 QT Interval:  360 QTC Calculation: 451 R Axis:   47 Text Interpretation:  Sinus rhythm Nonspecific intraventricular conduction delay Inferior infarct, old ST elevation, consider anterior injury No old tracing to compare Confirmed by Kathrynn Humble, MD, Thelma Comp [phone]) on 10/20/2016 2:24:40  AM Also confirmed by Lacinda Axon  MD, [place] ([id number])  on 10/20/2016 9:26:30 AM       Radiology No results found.  Procedures Procedures (including critical care time)  Medications Ordered in ED Medications    albuterol (PROVENTIL,VENTOLIN) solution continuous neb (10 mg/hr Nebulization Given 10/20/16 0321)     Initial Impression / Assessment and Plan / ED Course  I have reviewed the triage vital signs and the nursing notes.  Pertinent labs & imaging results that were available during my care of the patient were reviewed by me and considered in my medical decision making (see chart for details).     DDx includes: ICH Stroke ACS Sepsis syndrome Infection - UTI/Pneumonia Electrolyte abnormality Drug overdose DKA Metabolic disorders including thyroid disorders, adrenal insufficiency Hypercapnia / COPD  Likely medication related encephalopathy. No hypercapnia. WC is normal - and the event seems to be acute, so I dont think this is infection. CT head ordered.  Final Clinical Impressions(s) / ED Diagnoses   Final diagnoses:  Encephalopathy    New Prescriptions Discharge Medication List as of 10/21/2016 12:49 PM       Varney Biles, MD 10/23/16 [phone]

## 2017-11-28 ENCOUNTER — Encounter (HOSPITAL_COMMUNITY): Payer: Self-pay

## 2017-11-28 ENCOUNTER — Emergency Department (HOSPITAL_COMMUNITY): Payer: Medicare HMO

## 2017-11-28 ENCOUNTER — Emergency Department (HOSPITAL_COMMUNITY)
Admission: EM | Admit: 2017-11-28 | Discharge: 2017-11-28 | Disposition: A | Discharge status: Home / Self Care | Payer: Medicare HMO | Attending: Emergency Medicine | Admitting: Emergency Medicine

## 2017-11-28 DIAGNOSIS — F329 Major depressive disorder, single episode, unspecified: Secondary | ICD-10-CM | POA: Diagnosis not present

## 2017-11-28 DIAGNOSIS — N289 Disorder of kidney and ureter, unspecified: Secondary | ICD-10-CM

## 2017-11-28 DIAGNOSIS — I130 Hypertensive heart and chronic kidney disease with heart failure and stage 1 through stage 4 chronic kidney disease, or unspecified chronic kidney disease: Secondary | ICD-10-CM | POA: Insufficient documentation

## 2017-11-28 DIAGNOSIS — E1122 Type 2 diabetes mellitus with diabetic chronic kidney disease: Secondary | ICD-10-CM | POA: Diagnosis not present

## 2017-11-28 DIAGNOSIS — Z436 Encounter for attention to other artificial openings of urinary tract: Secondary | ICD-10-CM | POA: Diagnosis present

## 2017-11-28 DIAGNOSIS — Z79899 Other long term (current) drug therapy: Secondary | ICD-10-CM | POA: Insufficient documentation

## 2017-11-28 DIAGNOSIS — I509 Heart failure, unspecified: Secondary | ICD-10-CM | POA: Insufficient documentation

## 2017-11-28 DIAGNOSIS — N183 Chronic kidney disease, stage 3 (moderate): Secondary | ICD-10-CM | POA: Insufficient documentation

## 2017-11-28 DIAGNOSIS — J449 Chronic obstructive pulmonary disease, unspecified: Secondary | ICD-10-CM | POA: Diagnosis not present

## 2017-11-28 DIAGNOSIS — Z794 Long term (current) use of insulin: Secondary | ICD-10-CM | POA: Diagnosis not present

## 2017-11-28 DIAGNOSIS — Z936 Other artificial openings of urinary tract status: Secondary | ICD-10-CM | POA: Diagnosis not present

## 2017-11-28 DIAGNOSIS — Z7982 Long term (current) use of aspirin: Secondary | ICD-10-CM | POA: Insufficient documentation

## 2017-11-28 LAB — URINALYSIS, ROUTINE W REFLEX MICROSCOPIC
BILIRUBIN URINE: NEGATIVE
GLUCOSE, UA: NEGATIVE mg/dL
KETONES UR: NEGATIVE mg/dL
Nitrite: NEGATIVE
PROTEIN: 100 mg/dL — AB
Specific Gravity, Urine: 1.014 (ref 1.005–1.030)
pH: 6 (ref 5.0–8.0)

## 2017-11-28 LAB — CBC WITH DIFFERENTIAL/PLATELET
Basophils Absolute: 0 10*3/uL (ref 0.0–0.1)
Basophils Relative: 0 %
Eosinophils Absolute: 0.2 10*3/uL (ref 0.0–0.7)
Eosinophils Relative: 4 %
HEMATOCRIT: 25.9 % — AB (ref 39.0–52.0)
HEMOGLOBIN: 8 g/dL — AB (ref 13.0–17.0)
Lymphocytes Relative: 27 %
Lymphs Abs: 1.3 10*3/uL (ref 0.7–4.0)
MCH: 29 pg (ref 26.0–34.0)
MCHC: 30.9 g/dL (ref 30.0–36.0)
MCV: 93.8 fL (ref 78.0–100.0)
MONO ABS: 0.3 10*3/uL (ref 0.1–1.0)
MONOS PCT: 5 %
NEUTROS ABS: 3.1 10*3/uL (ref 1.7–7.7)
Neutrophils Relative %: 64 %
Platelets: 178 10*3/uL (ref 150–400)
RBC: 2.76 MIL/uL — ABNORMAL LOW (ref 4.22–5.81)
RDW: 14.4 % (ref 11.5–15.5)
WBC: 5 10*3/uL (ref 4.0–10.5)

## 2017-11-28 LAB — BASIC METABOLIC PANEL
ANION GAP: 12 (ref 5–15)
BUN: 30 mg/dL — ABNORMAL HIGH (ref 6–20)
CALCIUM: 8.7 mg/dL — AB (ref 8.9–10.3)
CHLORIDE: 97 mmol/L — AB (ref 101–111)
CO2: 28 mmol/L (ref 22–32)
Creatinine, Ser: 3.23 mg/dL — ABNORMAL HIGH (ref 0.61–1.24)
GFR calc Af Amer: 22 mL/min — ABNORMAL LOW (ref >60)
GFR calc non Af Amer: 19 mL/min — ABNORMAL LOW (ref >60)
Glucose, Bld: 146 mg/dL — ABNORMAL HIGH (ref 65–99)
Potassium: 4.1 mmol/L (ref 3.5–5.1)
Sodium: 137 mmol/L (ref 135–145)

## 2017-11-28 NOTE — ED Notes (Signed)
Pt resting,breathing easier on bi-pap. EDP reassessed at present time.

## 2017-11-28 NOTE — ED Triage Notes (Signed)
Pt had nephrostomy tubes placed approx 1 month ago at [facility].  Reports increased output in bags and blood urine.  C/O pain.  Pt reports can't see his nephrologist until April 18th. Pt alert and oriented.

## 2017-11-28 NOTE — Discharge Instructions (Signed)
Follow-up with his primary care doctor by phone for additional assistance at home.  I did speak to urology here.  They said that these are oftentimes placed by interventional radiology.  There is an attachment that we do not have here that can help hold the bags in place otherwise I does have to be taped in place.  They are designed to kind to be hanging out on the back side like they are.  Urine culture sent today urinalysis without any significant abnormalities.  Will be contacted if the urine cultures grow a significant infection.  Patient stable for discharge home at this time.

## 2017-11-28 NOTE — ED Notes (Signed)
Sam (Brother) requesting to be called when pt is discharged   Phone number: [phone]

## 2017-11-28 NOTE — ED Provider Notes (Signed)
[facility] EMERGENCY DEPARTMENT Provider Note   CSN: [id number] Arrival date & time: 11/28/17  1253     History   Chief Complaint Chief Complaint  Patient presents with  . increased output in nephrostomy bags    HPI Javier Ho. is a 62 y.o. male.  Patient brought in by her neighbor friend for concerns about nephrostomy tubes.  Patient was transferred from [facility] of on hospital on February 2 for probable sepsis.  Patient admitted to ICU prolonged course was finally discharged on February 16.  Patient was to go to rehab but patient refused.  Does have primary care doctor [place].  Also does have home nurses coming out.  Patient had nephrostomy tubes placed that are draining with 2 bags from his back area.  Patient last seen by primary care doctor the end of February.  Patient seen by nephrology yesterday.  Has follow-up scheduled for urology in the [place] area on March 18 and has follow-up with interventional radiology which probably placed the tubes on March 20.  Patient apparently seen at [facility] ED yesterday not clear what was done.  But patient was released.      Past Medical History:  Diagnosis Date  . Anemia   . Chest pain   . CHF (congestive heart failure) ([place])   . Chronic bronchitis ([place])   . Chronic headaches   . CKD (chronic kidney disease)   . Depressive disorder, not elsewhere classified   . GERD (gastroesophageal reflux disease)   . HBP (high blood pressure)   . Laceration of face, multiple sites   . Lesion of lung   . Morbid obesity ([place])   . Multiple rib fractures   . Other diseases of lung, not elsewhere classified   . PNA (pneumonia)   . Sepsis ([place])   . Shortness of breath   . Steatohepatitis   . Type II or unspecified type diabetes mellitus without mention of complication, uncontrolled   . Unspecified essential hypertension     Patient Active Problem List   Diagnosis Date Noted  . Morbid obesity ([place])  10/21/2016  . Chronic respiratory failure with hypoxia ([place]) 10/21/2016  . Chronic prescription benzodiazepine use 10/21/2016  . Chronic pain syndrome 10/21/2016  . Depression with anxiety 10/21/2016  . CKD (chronic kidney disease), stage III ([place]) 10/21/2016  . Encephalopathy 10/20/2016  . COPD (chronic obstructive pulmonary disease) ([place]) 10/20/2016  . Renal disease 10/20/2016  . Encephalopathy acute 10/20/2016  . Chronic use of opiate for therapeutic purpose 10/20/2016  . Diabetes mellitus with nephropathy ([place]) 10/20/2016  . Constipation 12/07/2014    Past Surgical History:  Procedure Laterality Date  . circumcision for Chronic Balanitis  2009  . COLONOSCOPY WITH ESOPHAGOGASTRODUODENOSCOPY (EGD)  04/08/2014  . cystoscopic left Ureteral stone removal  1996  . LUMBAR LAMINECTOMY  1976  . NEPHROSTOMY    . small intestine endoscopy  04/08/2014       Home Medications    Prior to Admission medications   Medication Sig Start Date End Date Taking? Authorizing Provider  albuterol (PROVENTIL,VENTOLIN) 90 MCG/ACT inhaler Inhale 2 puffs into the lungs every 6 (six) hours as needed for wheezing or shortness of breath.    Yes [provider]  ALPRAZolam Duanne Moron) 1 MG tablet Take 0.5 tablets (0.5 mg total) by mouth 2 (two) times daily as needed for anxiety. 10/21/16  Yes Rexene Alberts, MD  amitriptyline (ELAVIL) 50 MG tablet Take 1 tablet (50 mg  total) by mouth at bedtime. 10/21/16  Yes Rexene Alberts, MD  aspirin 81 MG chewable tablet Chew 81 mg by mouth daily.   Yes [provider]  B Complex Vitamins (B-COMPLEX/B-12 PO) Take 2,500 capsules by mouth daily.   Yes [provider]  BREO ELLIPTA 200-25 MCG/INH AEPB Inhale 1 puff into the lungs daily. 08/31/16  Yes [provider]  cephALEXin (KEFLEX) 500 MG capsule Take 500 mg by mouth 4 (four) times daily. 11/27/17  Yes [provider]  esomeprazole (NEXIUM) 20 MG capsule Take 20 mg by mouth daily  before breakfast.     Yes [provider]  furosemide (LASIX) 40 MG tablet Take 1 tablet by mouth daily. 10/06/16  Yes [provider]  gabapentin (NEURONTIN) 300 MG capsule Take 600 mg by mouth at bedtime.    Yes [provider]  INCRUSE ELLIPTA 62.5 MCG/INH AEPB Inhale 1 puff into the lungs daily. 08/31/16  Yes [provider]  insulin glargine (LANTUS) 100 UNIT/ML injection Inject 0.5 mLs (50 Units total) into the skin 3 (three) times daily. DO NOT TAKE INSULIN IF YOUR BLOOD SUGAR IS LESS THAN 120. 10/21/16  Yes Rexene Alberts, MD  metoCLOPramide (REGLAN) 10 MG tablet Take 1 tablet (10 mg total) by mouth 2 (two) times daily. Before meals and at bedtime 10/21/16  Yes Rexene Alberts, MD  oxyCODONE-acetaminophen (PERCOCET) 10-325 MG tablet Take 0.5 tablets by mouth every 4 (four) hours as needed for pain. 10/21/16  Yes Rexene Alberts, MD  pantoprazole (PROTONIX) 40 MG tablet Take 40 mg by mouth daily. 11/11/17  Yes [provider]  PARoxetine (PAXIL) 40 MG tablet Take 40 mg by mouth every morning.     Yes [provider]  tamsulosin (FLOMAX) 0.4 MG CAPS capsule Take 0.4 mg by mouth 2 (two) times daily.    Yes [provider]    Family History No family history on file.  Social History Social History   Tobacco Use  . Smoking status: Never Smoker  . Smokeless tobacco: Never Used  Substance Use Topics  . Alcohol use: No    Alcohol/week: 0.0 oz  . Drug use: No     Allergies   Sulfa antibiotics and Meperidine   Review of Systems Review of Systems  Constitutional: Negative for fever.  HENT: Negative for congestion.   Eyes: Negative for redness.  Respiratory: Negative for shortness of breath.   Cardiovascular: Negative for chest pain.  Gastrointestinal: Negative for abdominal pain.  Genitourinary: Positive for hematuria.  Musculoskeletal: Negative for back pain.  Neurological: Negative for headaches.  Hematological: Does not  bruise/bleed easily.  Psychiatric/Behavioral: Negative for confusion.     Physical Exam Updated Vital Signs BP 125/79   Pulse 89   Temp 97.7 F (36.5 C) (Oral)   Resp 17   Ht 1.905 m (6' 3" )   Wt 110.7 kg (244 lb)   SpO2 95%   BMI 30.50 kg/m   Physical Exam  Constitutional: He is oriented to person, place, and time. He appears well-developed and well-nourished. No distress.  HENT:  Head: Normocephalic and atraumatic.  Mouth/Throat: Oropharynx is clear and moist.  Eyes: Conjunctivae and EOM are normal. Pupils are equal, round, and reactive to light.  Neck: Neck supple.  Cardiovascular: Normal rate and regular rhythm.  Pulmonary/Chest: Effort normal and breath sounds normal.  Abdominal: Soft. Bowel sounds are normal. There is no tenderness.  Large anterior ventral hernia reducible.  Musculoskeletal: Normal range of motion. He exhibits edema.  The back area with nephrostomy tubes that drain into small bags.  Each 1 separate.  Wounds on the backside look well-healed no evidence of infection.  The left nephrostomy tube has a little bit of blood in the bag.  The right has non-visually.  Neurological: He is alert and oriented to person, place, and time. No cranial nerve deficit. He exhibits normal muscle tone. Coordination normal.  Skin: Skin is warm.  Nursing note and vitals reviewed.    ED Treatments / Results  Labs (all labs ordered are listed, but only abnormal results are displayed) Labs Reviewed  CBC WITH DIFFERENTIAL/PLATELET - Abnormal; Notable for the following components:      Result Value   RBC 2.76 (*)    Hemoglobin 8.0 (*)    HCT 25.9 (*)    All other components within normal limits  BASIC METABOLIC PANEL - Abnormal; Notable for the following components:   Chloride 97 (*)    Glucose, Bld 146 (*)    BUN 30 (*)    Creatinine, Ser 3.23 (*)    Calcium 8.7 (*)    GFR calc non Af Amer 19 (*)    GFR calc Af Amer 22 (*)    All other components within normal limits   URINALYSIS, ROUTINE W REFLEX MICROSCOPIC - Abnormal; Notable for the following components:   APPearance HAZY (*)    Hgb urine dipstick LARGE (*)    Protein, ur 100 (*)    Leukocytes, UA LARGE (*)    Bacteria, UA RARE (*)    Squamous Epithelial / LPF 0-5 (*)    All other components within normal limits  URINE CULTURE  URINE CULTURE    EKG  EKG Interpretation None       Radiology Ct Renal Stone Study  Result Date: 11/28/2017 CLINICAL DATA:  Bilateral flank pain with hematuria EXAM: CT ABDOMEN AND PELVIS WITHOUT CONTRAST TECHNIQUE: Multidetector CT imaging of the abdomen and pelvis was performed following the standard protocol without IV contrast. COMPARISON:  None. FINDINGS: Lower chest: Bibasilar scarring is noted. Small pericardial effusion is noted. Hepatobiliary: Gallbladder is well distended. The liver is within normal limits. Pancreas: Unremarkable. No pancreatic ductal dilatation or surrounding inflammatory changes. Spleen: Normal in size without focal abnormality. Adrenals/Urinary Tract: Adrenal glands are within normal limits. Bilateral staghorn calculi are identified. Additionally a 10 mm left UPJ stone is noted. Bilateral nephrostomy catheters are noted within the renal pelves. The left nephrostomy catheter shows it is distal marker at the level of the renal margin with sideholes within the renal parenchyma. Left renal cyst is again noted posteriorly. No obstructive changes are seen. Bladder is decompressed. Stomach/Bowel: Mild degree of constipation is noted with fecal material throughout the colon. Fluid-filled loops of proximal jejunum are noted. The more distal ileum is decompressed. The appendix is not well visualized although no inflammatory changes to suggest appendicitis are noted. Vascular/Lymphatic: No significant vascular findings are present. No enlarged abdominal or pelvic lymph nodes. Reproductive: Prostate is within normal limits. Other: No abdominal wall hernia or  abnormality. No abdominopelvic ascites. Musculoskeletal: Degenerative changes of lumbar spine are noted. Changes of prior vertebral augmentation at L3 are noted. IMPRESSION: Changes of mild constipation. Large bilateral staghorn calculi as well as a left UPJ stone without obstructive change due to the nephrostomy catheters. Left nephrostomy catheter has withdrawn slightly with sideholes within the renal parenchyma posteriorly. The need for further evaluation can be determined on a clinical basis. Small pericardial effusion. Electronically Signed   By:  Inez Catalina M.D.   On: 11/28/2017 15:48    Procedures Procedures (including critical care time)  Medications Ordered in ED Medications - No data to display   Initial Impression / Assessment and Plan / ED Course  I have reviewed the triage vital signs and the nursing notes.  Pertinent labs & imaging results that were available during my care of the patient were reviewed by me and considered in my medical decision making (see chart for details).     Extensive review on epic care everywhere to try to figure out what has been going on with him.  I suspect that during his ICU stay it appears patient underwent dialysis for short period of time and then was stopped before discharge.  Also had interventional radiology place the bilateral nephrostomy tubes.  Patient must of had obstructing stones.  Could have been the course source of sepsis.  Patient has some home health arrangements.  But sounds like home environment is not the best has a good primary care doctor and also does have home nurses coming out.  Patient has follow-up arranged about mid month with interventional radiology in [place] area and also with urology in the [place] area.  Patient seem to be baseline vital signs without any significant abnormalities no fevers.  Labs fairly stable.  Patient does have renal insufficiency nothing to compare but potassium was fine.  Patient is already had  follow-up with nephrology.  Discussed with our on-call urologist about the nature of the bags.  He says it sounds kind of status quo they need to be taped in place down low so they can drain.  He says this location is usually awkward almost always done by interventional radiology.  CT scan showed that both tubes seem to be functioning well.  The left tube and pulled out some but all the holes are still inside the kidney.  Urinalysis was obviously not normal but had no positive nitrite.  Urine culture sent to determine if there is significant infection.  Clinically patient not acting as if there is significant infection.  Contacted patient's friend who came to take him home recommended that they contact the primary care doctor tomorrow have her contact the interventional radiologist in the [place] area to see if he can get closer follow-up.  Apparently according to our urologist there is a clip they can help hold these bags in place that would be more practical.  We do not have that here.  Final Clinical Impressions(s) / ED Diagnoses   Final diagnoses:  Nephrostomy status [facility])  Renal insufficiency    ED Discharge Orders    None       Fredia Sorrow, MD 11/28/17 2219

## 2017-12-03 LAB — URINE CULTURE: Culture: >=100000 — AB

## 2017-12-04 ENCOUNTER — Telehealth: Payer: Self-pay | Admitting: Emergency Medicine

## 2017-12-04 NOTE — Progress Notes (Signed)
ED Antimicrobial Stewardship Positive Culture Follow Up   Javier Ho. is an 62 y.o. male who presented to [facility] on 11/28/2017 with a chief complaint of increased urinary output, has bilateral nephrostomy tubes in place.  Chief Complaint  Patient presents with  . increased output in nephrostomy bags    Recent Results (from the past 720 hour(s))  Urine Culture     Status: Abnormal (Preliminary result)   Collection Time: 11/28/17  3:01 PM  Result Value Ref Range Status   Specimen Description   Final    KIDNEY LEFT Performed at [facility], [address], [place], [place] [address]    Special Requests   Final    NONE Performed at [facility], [address] [place], [place] [address]    Culture (A)  Final    >=100,000 COLONIES/mL PSEUDOMONAS FLUORESCENS Sent to [place] for further susceptibility testing. Performed at [facility], [place] [address], [place], [place] [address]    Report Status PENDING  Incomplete  Urine Culture     Status: Abnormal   Collection Time: 11/28/17  3:01 PM  Result Value Ref Range Status   Specimen Description   Final    KIDNEY RIGHT Performed at [facility], [address], [place], [place] [address]    Special Requests   Final    NONE Performed at [facility], [address], [place], [place] [address]    Culture (A)  Final    >=100,000 COLONIES/mL PSEUDOMONAS FLUORESCENS >=100,000 COLONIES/mL CANDIDA GLABRATA    Report Status 12/03/2017 FINAL  Final   Organism ID, Bacteria PSEUDOMONAS FLUORESCENS (A)  Final      Susceptibility   Pseudomonas fluorescens - MIC*    CEFTAZIDIME 4 SENSITIVE Sensitive     CIPROFLOXACIN <=0.25 SENSITIVE Sensitive     GENTAMICIN <=1 SENSITIVE Sensitive     IMIPENEM <=0.25 SENSITIVE Sensitive     PIP/TAZO >=128 RESISTANT Resistant     CEFEPIME <=1 SENSITIVE Sensitive     * >=100,000 COLONIES/mL PSEUDOMONAS FLUORESCENS    [x]  Treated with cephalexin, organism resistant to prescribed  antimicrobial  New antibiotic prescription: Call for symptom check. Discontinue cephalexin. Follow-up with urology on 3/18.  ED Provider: Kennith Maes PA-C   Leroy Libman, PharmD Pharmacy Resident Pager: [phone]

## 2017-12-04 NOTE — Telephone Encounter (Signed)
Post ED Visit - Positive Culture Follow-up: Successful Patient Follow-Up  Culture assessed and recommendations reviewed by: []  Elenor Quinones, Pharm.D. []  Heide Guile, Pharm.D., BCPS AQ-ID []  Parks Neptune, Pharm.D., BCPS []  Alycia Rossetti, Pharm.D., BCPS []  [place], Pharm.D., BCPS, AAHIVP []  Legrand Como, Pharm.D., BCPS, AAHIVP []  Salome Arnt, PharmD, BCPS []  Dimitri Ped, PharmD, BCPS []  Vincenza Hews, PharmD, BCPS Leroy Libman PA  Positive urine culture  []  Patient discharged without antimicrobial prescription and treatment is now indicated []  Organism is resistant to prescribed ED discharge antimicrobial []  Patient with positive blood cultures  Changes discussed with ED provider: Kennith Maes PA New antibiotic prescription symptom check, stop Keflex, f/u with urology @ [facility] on 3/18  Attempting to contact patient   Hazle Nordmann 12/04/2017, 1:27 PM

## 2017-12-08 LAB — URINE CULTURE

## 2017-12-08 LAB — SUSCEPTIBILITY, AER + ANAEROB

## 2017-12-08 LAB — SUSCEPTIBILITY RESULT

## 2017-12-09 ENCOUNTER — Telehealth: Payer: Self-pay | Admitting: *Deleted

## 2017-12-09 NOTE — Telephone Encounter (Signed)
Post ED Visit - Positive Culture Follow-up: Successful Patient Follow-Up  Culture assessed and recommendations reviewed by: []  Elenor Quinones, Pharm.D. []  Heide Guile, Pharm.D., BCPS AQ-ID []  Parks Neptune, Pharm.D., BCPS []  Alycia Rossetti, Pharm.D., BCPS []  [place], [place].D., BCPS, AAHIVP []  Legrand Como, Pharm.D., BCPS, AAHIVP []  Salome Arnt, PharmD, BCPS []  Dimitri Ped, PharmD, BCPS []  Vincenza Hews, PharmD, BCPS Georga Bora, PharmD  Positive urine culture  [x]  Additional antimicrobial prescription  treatment is now indicated []  Organism is resistant to prescribed ED discharge antimicrobial []  Patient with positive blood cultures  Changes discussed with ED provider, Rodell Perna, PA-C New antibiotic prescription Cipro 224m PO BID x 7 days [facility] [place] [employer] [place] return to ED if develops patient develops fever, verbalized understanding. Contacted patient and patient's brother (Inocente Salles date 12/09/2017, time 1Chloride3/17/2019, 11:48 AM

## 2017-12-09 NOTE — Progress Notes (Addendum)
**Addendum - After discussion with patient's brother, the patient does not have an appointment until some time in April. Given recent hospitalization with nephrostomy tubes placed, the ED provider and I agree that the patient should be treated with oral antibiotics and have a low threshold for returning to the hospital if symptoms get worse.   Plan: Cipro 218m BID x7 days  _________________________________________________________   ED Antimicrobial Stewardship Positive Culture Follow Up   Javier Ho is an 62y.o. male [facility] a [facility]  Chief Complaint  Patient presents with  . increased output in nephrostomy bags    Recent Results (from the past 720 hour(s))  Urine Culture     Status: Abnormal   Collection Time: 11/28/17  3:01 PM  Result Value Ref Range Status   Specimen Description   Final    KIDNEY LEFT [facility]formed at [facility] [address], [place] [address]   Special Requests   Final    NONE [facility]formed at [facility] [address] [place] [address]   Culture (A)  Final    >=100,000 COLONIES/mL PSEUDOMONAS FLUORESCENS SEE SEPARATE REPORT FOR SUSCEPTIBILITY TESTING PERFORMED BY [facility] Performed at [facility] [address] [place] [address]   Report Status 12/08/2017 FINAL  Final  Urine Culture     Status: Abnormal   Collection Time: 11/28/17  3:01 PM  Result Value Ref Range Status   Specimen Description   Final    KIDNEY RIGHT [facility]formed at [facility] [address] [place] [address]   Special Requests   Final    NONE Performed at [facility] [address], [place] [address]   Culture (A)  Final    >=100,000 COLONIES/mL PSEUDOMONAS FLUORESCENS >=100,000 COLONIES/mL CANDIDA GLABRATA    Report Status 12/03/2017 FINAL  Final   Organism ID, Bacteria PSEUDOMONAS FLUORESCENS (A)  Final      Susceptibility   Pseudomonas fluorescens - MIC*   CEFTAZIDIME 4 SENSITIVE Sensitive     CIPROFLOXACIN <=0.25 SENSITIVE Sensitive     GENTAMICIN <=1 SENSITIVE Sensitive     IMIPENEM <=0.25 SENSITIVE Sensitive     PIP/TAZO >=128 RESISTANT Resistant     CEFEPIME <=1 SENSITIVE Sensitive     * >=100,000 COLONIES/mL PSEUDOMONAS FLUORESCENS  Susceptibility, Aer + Anaerob     Status: None   Collection Time: 11/28/17  3:01 PM  Result Value Ref Range Status   Suscept, Aer + Anaerob Final report  Final    Comment: (NOTE) Performed At: [id number] FarmerMD PXN:[id number]   Source of Sample URINE, RANDOM  Final    Comment: Performed at [facility] [address], [place] [address] Susceptibility Result     Status: Abnormal   Collection Time: 11/28/17  3:01 PM  Result Value Ref Range Status   Suscept Result 1 Comment (A)  Final    Comment: (NOTE) Pseudomonas fluorescens Identification performed by account, not confirmed by this laboratory.    Antimicrobial Suscept Comment  Final    Comment: (NOTE)      ** S = Susceptible; I = Intermediate; R = Resistant **                   P = Positive; N = Negative  MICS are expressed in micrograms per mL   Antibiotic                 RSLT#1    RSLT#2    RSLT#3    RSLT#4 Amikacin                       S Aztreonam                      R Cefepime                       S Cefotaxime                     I Ceftazidime                    S Ceftriaxone                    I Ciprofloxacin                  S Gentamicin                     S Imipenem                       S Levofloxacin                   S Meropenem                      S Piperacillin/Tazobactam        S Tetracycline                   S Ticarcillin/Clavulanate        R Tobramycin                     S Trimethoprim/Sulfa             R Performed At: [facility] [place], [place] [id number] Rush Farmer MD UO:[id number] Performed  at [place] [address], [place], [place] [address]     [x]  Treated with Kelfex, organism resistant to prescribed antimicrobial []  Patient discharged originally without antimicrobial agent and treatment is now indicated  Per recent progress notes, the patient is scheduled for an appointment tomorrow 3/18 with [facility] urology. Treatment options have been deferred to the urologist. If the appointment has been canceled or postponed, please contact pharmacy for further recommendations.   ED Provider: Denton Ar, PA-C   [place] 12/09/2017, 10:01 AM Infectious Diseases Pharmacist Phone# [phone]

## 2018-02-11 ENCOUNTER — Encounter: Payer: Self-pay | Admitting: Internal Medicine

## 2018-05-01 ENCOUNTER — Ambulatory Visit: Payer: Medicare HMO | Admitting: Nurse Practitioner

## 2018-05-01 NOTE — Progress Notes (Deleted)
Primary Care Physician:  Deloria Lair., MD Primary Gastroenterologist:  Dr.   Rayne Du chief complaint on file.   HPI:   Javier Ho. is a 62 y.o. male who presents referral from primary care for cirrhosis/elevated LFTs.  Reviewed information provided with the referral including ***.   Recent abdominal imaging dated 11/28/2017 in our system in the emergency department with a CT renal stone study which found well distended gallbladder and liver within normal limits.  Overall impression with changes of mild constipation, large bilateral staghorn calculi, other non-GI findings.  Last CMP found in our system found elevated creatinine 2.29 which appears to be at baseline, normal AST/ALT/alkaline phosphatase/bilirubin.  Last colonoscopy report available in our system completed 12/07/2014 at [facility] included megacolon with large amount of fluid, no polyps.  Recommended repeat colonoscopy in 10 years.  Today he states   Past Medical History:  Diagnosis Date  . Anemia   . Chest pain   . CHF (congestive heart failure) ([place])   . Chronic bronchitis ([place])   . Chronic headaches   . CKD (chronic kidney disease)   . Depressive disorder, not elsewhere classified   . GERD (gastroesophageal reflux disease)   . HBP (high blood pressure)   . Laceration of face, multiple sites   . Lesion of lung   . Morbid obesity ([place])   . Multiple rib fractures   . Other diseases of lung, not elsewhere classified   . PNA (pneumonia)   . Sepsis ([place])   . Shortness of breath   . Steatohepatitis   . Type II or unspecified type diabetes mellitus without mention of complication, uncontrolled   . Unspecified essential hypertension     Past Surgical History:  Procedure Laterality Date  . circumcision for Chronic Balanitis  2009  . COLONOSCOPY WITH ESOPHAGOGASTRODUODENOSCOPY (EGD)  04/08/2014  . cystoscopic left Ureteral stone removal  1996  . LUMBAR LAMINECTOMY  1976  . NEPHROSTOMY      . small intestine endoscopy  04/08/2014    Current Outpatient Medications  Medication Sig Dispense Refill  . albuterol (PROVENTIL,VENTOLIN) 90 MCG/ACT inhaler Inhale 2 puffs into the lungs every 6 (six) hours as needed for wheezing or shortness of breath.     . ALPRAZolam (XANAX) 1 MG tablet Take 0.5 tablets (0.5 mg total) by mouth 2 (two) times daily as needed for anxiety.    Marland Kitchen amitriptyline (ELAVIL) 50 MG tablet Take 1 tablet (50 mg total) by mouth at bedtime.    Marland Kitchen aspirin 81 MG chewable tablet Chew 81 mg by mouth daily.    . B Complex Vitamins (B-COMPLEX/B-12 PO) Take 2,500 capsules by mouth daily.    Marland Kitchen BREO ELLIPTA 200-25 MCG/INH AEPB Inhale 1 puff into the lungs daily.    . cephALEXin (KEFLEX) 500 MG capsule Take 500 mg by mouth 4 (four) times daily.  0  . esomeprazole (NEXIUM) 20 MG capsule Take 20 mg by mouth daily before breakfast.      . furosemide (LASIX) 40 MG tablet Take 1 tablet by mouth daily.    Marland Kitchen gabapentin (NEURONTIN) 300 MG capsule Take 600 mg by mouth at bedtime.     . INCRUSE ELLIPTA 62.5 MCG/INH AEPB Inhale 1 puff into the lungs daily.    . insulin glargine (LANTUS) 100 UNIT/ML injection Inject 0.5 mLs (50 Units total) into the skin 3 (three) times daily. DO NOT TAKE INSULIN IF YOUR BLOOD SUGAR IS LESS THAN 120.    Marland Kitchen  metoCLOPramide (REGLAN) 10 MG tablet Take 1 tablet (10 mg total) by mouth 2 (two) times daily. Before meals and at bedtime    . oxyCODONE-acetaminophen (PERCOCET) 10-325 MG tablet Take 0.5 tablets by mouth every 4 (four) hours as needed for pain.    . pantoprazole (PROTONIX) 40 MG tablet Take 40 mg by mouth daily.    Marland Kitchen PARoxetine (PAXIL) 40 MG tablet Take 40 mg by mouth every morning.      . tamsulosin (FLOMAX) 0.4 MG CAPS capsule Take 0.4 mg by mouth 2 (two) times daily.      No current facility-administered medications for this visit.     Allergies as of 05/01/2018 - Review Complete 11/28/2017  Allergen Reaction Noted  . Sulfa antibiotics Nausea And  Vomiting 10/27/2017  . Meperidine Other (See Comments) 01/07/2015    No family history on file.  Social History   Socioeconomic History  . Marital status: Widowed    Spouse name: MAE  . Number of children: 0  . Years of education: Not on file  . Highest education level: Not on file  Occupational History  . Occupation: DISABLED  Social Needs  . Financial resource strain: Not on file  . Food insecurity:    Worry: Not on file    Inability: Not on file  . Transportation needs:    Medical: Not on file    Non-medical: Not on file  Tobacco Use  . Smoking status: Never Smoker  . Smokeless tobacco: Never Used  Substance and Sexual Activity  . Alcohol use: No    Alcohol/week: 0.0 oz  . Drug use: No  . Sexual activity: Not on file  Lifestyle  . Physical activity:    Days per week: Not on file    Minutes per session: Not on file  . Stress: Not on file  Relationships  . Social connections:    Talks on phone: Not on file    Gets together: Not on file    Attends religious service: Not on file    Active member of club or organization: Not on file    Attends meetings of clubs or organizations: Not on file    Relationship status: Not on file  . Intimate partner violence:    Fear of current or ex partner: Not on file    Emotionally abused: Not on file    Physically abused: Not on file    Forced sexual activity: Not on file  Other Topics Concern  . Not on file  Social History Narrative  . Not on file    Review of Systems: General: Negative for anorexia, weight loss, fever, chills, fatigue, weakness. Eyes: Negative for vision changes.  ENT: Negative for hoarseness, difficulty swallowing , nasal congestion. CV: Negative for chest pain, angina, palpitations, dyspnea on exertion, peripheral edema.  Respiratory: Negative for dyspnea at rest, dyspnea on exertion, cough, sputum, wheezing.  GI: See history of present illness. GU:  Negative for dysuria, hematuria, urinary  incontinence, urinary frequency, nocturnal urination.  MS: Negative for joint pain, low back pain.  Derm: Negative for rash or itching.  Neuro: Negative for weakness, abnormal sensation, seizure, frequent headaches, memory loss, confusion.  Psych: Negative for anxiety, depression, suicidal ideation, hallucinations.  Endo: Negative for unusual weight change.  Heme: Negative for bruising or bleeding. Allergy: Negative for rash or hives.    Physical Exam: There were no vitals taken for this visit. General:   Alert and oriented. Pleasant and cooperative. Well-nourished and well-developed.  Head:  Normocephalic and atraumatic. Eyes:  Without icterus, sclera clear and conjunctiva pink.  Ears:  Normal auditory acuity. Mouth:  No deformity or lesions, oral mucosa pink.  Throat/Neck:  Supple, without mass or thyromegaly. Cardiovascular:  S1, S2 present without murmurs appreciated. Normal pulses noted. Extremities without clubbing or edema. Respiratory:  Clear to auscultation bilaterally. No wheezes, rales, or rhonchi. No distress.  Gastrointestinal:  +BS, soft, non-tender and non-distended. No HSM noted. No guarding or rebound. No masses appreciated.  Rectal:  Deferred  Musculoskalatal:  Symmetrical without gross deformities. Normal posture. Skin:  Intact without significant lesions or rashes. Neurologic:  Alert and oriented x4;  grossly normal neurologically. Psych:  Alert and cooperative. Normal mood and affect. Heme/Lymph/Immune: No significant cervical adenopathy. No excessive bruising noted.    05/01/2018 10:35 AM   Disclaimer: This note was dictated with voice recognition software. Similar sounding words can inadvertently be transcribed and may not be corrected upon review.

## 2018-05-09 ENCOUNTER — Ambulatory Visit: Payer: Medicare HMO | Admitting: Nurse Practitioner

## 2018-05-28 ENCOUNTER — Encounter: Payer: Self-pay | Admitting: Nurse Practitioner

## 2018-05-28 ENCOUNTER — Ambulatory Visit: Payer: Medicare HMO | Admitting: Nurse Practitioner

## 2018-05-28 VITALS — BP 138/89 | HR 98 | Temp 96.6°F | Ht 73.0 in | Wt 251.6 lb

## 2018-05-28 DIAGNOSIS — K5900 Constipation, unspecified: Secondary | ICD-10-CM

## 2018-05-28 NOTE — Progress Notes (Signed)
Primary Care Physician:  Deloria Lair., MD Primary Gastroenterologist:  Dr. Gala Romney  Chief Complaint  Patient presents with  . Constipation    takes pain med    HPI:   Javier Ho. is a 62 y.o. male who presents for constipation.  The patient has not been seen by our office since 2016.  At that time he was seen for constipation, last visit on 04/08/2015.  At that time he noted Amitiza worked initially but stopped.  He did establish on a regimen of MiraLAX daily and avoid dairy, which caused constipation.  If he does this his symptoms are manageable.  Recommended he continue this regimen and add over-the-counter Colace, follow-up in 6 months.  He was a no-show for his follow-up office visit.  Last colonoscopy 2015 with no polyps. Likely 10 year repeat interval (2025).  Today he states his constipation is back. Still avoiding dairy, taking MiraLAX. Colace not helping. Having a bowel movement about every 4 day. Stools hard, straining, difficulty passing. Incomplete emptying. Denies hematochezia, melena, fever, chills, unintentional weight loss, acute changes in bowel habits. Denies abdominal pain. Denies chest pain, dyspnea, dizziness, lightheadedness, syncope, near syncope. Denies any other upper or lower GI symptoms.  Past Medical History:  Diagnosis Date  . Anemia   . Chest pain   . CHF (congestive heart failure) ([place])   . Chronic bronchitis ([place])   . Chronic headaches   . CKD (chronic kidney disease)   . Depressive disorder, not elsewhere classified   . GERD (gastroesophageal reflux disease)   . HBP (high blood pressure)   . Laceration of face, multiple sites   . Lesion of lung   . Morbid obesity ([place])   . Multiple rib fractures   . Other diseases of lung, not elsewhere classified   . PNA (pneumonia)   . Sepsis ([place])   . Shortness of breath   . Steatohepatitis   . Type II or unspecified type diabetes mellitus without mention of complication, uncontrolled   .  Unspecified essential hypertension     Past Surgical History:  Procedure Laterality Date  . circumcision for Chronic Balanitis  2009  . COLONOSCOPY WITH ESOPHAGOGASTRODUODENOSCOPY (EGD)  04/08/2014  . cystoscopic left Ureteral stone removal  1996  . LUMBAR LAMINECTOMY  1976  . NEPHROSTOMY    . small intestine endoscopy  04/08/2014    Current Outpatient Medications  Medication Sig Dispense Refill  . albuterol (PROVENTIL,VENTOLIN) 90 MCG/ACT inhaler Inhale 2 puffs into the lungs every 6 (six) hours as needed for wheezing or shortness of breath.     . ALPRAZolam (XANAX) 1 MG tablet Take 0.5 tablets (0.5 mg total) by mouth 2 (two) times daily as needed for anxiety.    Marland Kitchen amitriptyline (ELAVIL) 50 MG tablet Take 1 tablet (50 mg total) by mouth at bedtime. (Patient taking differently: Take 100 mg by mouth at bedtime. )    . aspirin 81 MG chewable tablet Chew 81 mg by mouth daily.    . furosemide (LASIX) 40 MG tablet Take 1 tablet by mouth daily.    Marland Kitchen gabapentin (NEURONTIN) 300 MG capsule Take 600 mg by mouth 2 (two) times daily.     . INCRUSE ELLIPTA 62.5 MCG/INH AEPB Inhale 1 puff into the lungs daily.    . insulin glargine (LANTUS) 100 UNIT/ML injection Inject 0.5 mLs (50 Units total) into the skin 3 (three) times daily. DO NOT TAKE INSULIN IF YOUR BLOOD SUGAR IS LESS THAN 120.    Marland Kitchen  metoCLOPramide (REGLAN) 10 MG tablet Take 1 tablet (10 mg total) by mouth 2 (two) times daily. Before meals and at bedtime (Patient taking differently: Take 10 mg by mouth daily. Before meals and at bedtime )    . oxyCODONE-acetaminophen (PERCOCET) 10-325 MG tablet Take 0.5 tablets by mouth every 4 (four) hours as needed for pain. (Patient taking differently: Take 1 tablet by mouth 5 (five) times daily. )    . pantoprazole (PROTONIX) 40 MG tablet Take 40 mg by mouth daily.    Marland Kitchen PARoxetine (PAXIL) 40 MG tablet Take 40 mg by mouth every morning.       No current facility-administered medications for this visit.      Allergies as of 05/28/2018 - Review Complete 05/28/2018  Allergen Reaction Noted  . Sulfa antibiotics Nausea And Vomiting 10/27/2017  . Meperidine Other (See Comments) 01/07/2015    Family History  Problem Relation Age of Onset  . Colon cancer Neg Hx     Social History   Socioeconomic History  . Marital status: Widowed    Spouse name: MAE  . Number of children: 0  . Years of education: Not on file  . Highest education level: Not on file  Occupational History  . Occupation: DISABLED  Social Needs  . Financial resource strain: Not on file  . Food insecurity:    Worry: Not on file    Inability: Not on file  . Transportation needs:    Medical: Not on file    Non-medical: Not on file  Tobacco Use  . Smoking status: Never Smoker  . Smokeless tobacco: Never Used  Substance and Sexual Activity  . Alcohol use: No    Alcohol/week: 0.0 standard drinks  . Drug use: No  . Sexual activity: Not on file  Lifestyle  . Physical activity:    Days per week: Not on file    Minutes per session: Not on file  . Stress: Not on file  Relationships  . Social connections:    Talks on phone: Not on file    Gets together: Not on file    Attends religious service: Not on file    Active member of club or organization: Not on file    Attends meetings of clubs or organizations: Not on file    Relationship status: Not on file  . Intimate partner violence:    Fear of current or ex partner: Not on file    Emotionally abused: Not on file    Physically abused: Not on file    Forced sexual activity: Not on file  Other Topics Concern  . Not on file  Social History Narrative  . Not on file    Review of Systems: General: Negative for anorexia, weight loss, fever, chills, fatigue, weakness. ENT: Negative for hoarseness, difficulty swallowing. CV: Negative for chest pain, angina, palpitations, peripheral edema.  Respiratory: Negative for dyspnea at rest, cough, sputum, wheezing.  GI: See  history of present illness. MS: Admits chronic pain.  Derm: Negative for rash or itching.  Endo: Negative for unusual weight change.  Heme: Negative for bruising or bleeding. Allergy: Negative for rash or hives.    Physical Exam: BP 138/89   Pulse 98   Temp (!) 96.6 F (35.9 C) (Oral)   Ht 6' 1"  (1.854 m)   Wt 251 lb 9.6 oz (114.1 kg)   BMI 33.19 kg/m  General:   Alert and oriented. Pleasant and cooperative. Well-nourished and well-developed.  Head:  Normocephalic and atraumatic.  Eyes:  Without icterus, sclera clear and conjunctiva pink.  Ears:  Normal auditory acuity. Cardiovascular:  S1, S2 present without murmurs appreciated. Extremities without clubbing or edema. Respiratory:  Clear to auscultation bilaterally. No wheezes, rales, or rhonchi. No distress.  Gastrointestinal:  +BS, soft, non-tender and non-distended. No HSM noted. No guarding or rebound. No masses appreciated.  Rectal:  Deferred  Musculoskalatal:  Symmetrical without gross deformities. Neurologic:  Alert and oriented x4;  Mild tremors noted. Psych:  Alert and cooperative. Normal mood and affect. Heme/Lymph/Immune: No excessive bruising noted.    05/28/2018 10:03 AM   Disclaimer: This note was dictated with voice recognition software. Similar sounding words can inadvertently be transcribed and may not be corrected upon review.

## 2018-05-28 NOTE — Progress Notes (Signed)
CC'D TO PCP °

## 2018-05-28 NOTE — Patient Instructions (Signed)
1. I am giving you samples of Amitiza 24 mcg to last a week. 2. Call us in 1 to 2 weeks and let us know if this is helping your constipation. 3. Return for follow-up in 3 months. 4. Call us if you have any questions or concerns.  At [facility] we value your feedback. You may receive a survey about your visit today. Please share your experience as we strive to create trusting relationships with our patients to provide genuine, compassionate, quality care.  We appreciate your understanding and patience as we review any laboratory studies, imaging, and other diagnostic tests that are ordered as we care for you. Our office policy is 5 business days for review of these results, and any emergent or urgent results are addressed in a timely manner for your best interest. If you do not hear from our office in 1 week, please contact us.   We also encourage the use of MyChart, which contains your medical information for your review as well. If you are not enrolled in this feature, an access code is on this after visit summary for your convenience. Thank you for allowing [place] to be involved in your care.  It was great to see you today!  I hope you have a great summer!!

## 2018-05-28 NOTE — Assessment & Plan Note (Signed)
Noted recurrent constipation.  Previously did somewhat well with Amitiza although it lost effect.  When we last saw him he was managing his symptoms based on dairy avoidance and MiraLAX.  He continues to do this, but has noted worsening constipation.  Has a bowel movement about every 4 days which are hard and require straining.  No hematochezia.  Colonoscopy is up-to-date.  We are somewhat limited for constipation options given his kidney dysfunction.  I will restart Amitiza 24 mcg to see if this helps.  Request progress report 1 to 2 weeks and follow-up in 3 months.

## 2018-05-29 ENCOUNTER — Telehealth: Payer: Self-pay | Admitting: Internal Medicine

## 2018-05-29 NOTE — Telephone Encounter (Signed)
[place], DIES NOT KNOW HOW TO TAKE THE SAMPLES HE WAS GIVEN YESTERDAY

## 2018-05-29 NOTE — Telephone Encounter (Signed)
Tried calling pt x 7. Number is busy. Will try again.

## 2018-05-29 NOTE — Telephone Encounter (Signed)
Lmom, waiting on a return call.  

## 2018-05-31 NOTE — Telephone Encounter (Signed)
Lmom, waiting on a return call.  

## 2018-06-03 NOTE — Telephone Encounter (Signed)
Lmom, waiting on a return call.  

## 2018-06-04 ENCOUNTER — Telehealth: Payer: Self-pay | Admitting: Nurse Practitioner

## 2018-06-04 NOTE — Telephone Encounter (Signed)
Letter mailed to pt.  

## 2018-06-04 NOTE — Telephone Encounter (Signed)
Patient called and said the samples Javier Ho gave him did not work at all.

## 2018-06-05 NOTE — Telephone Encounter (Signed)
Lmom, waiting on a return call.  

## 2018-09-04 ENCOUNTER — Telehealth: Payer: Self-pay

## 2018-09-04 ENCOUNTER — Ambulatory Visit: Payer: Medicare HMO | Admitting: Nurse Practitioner

## 2018-09-04 NOTE — Telephone Encounter (Signed)
OV made and appt card mailed

## 2018-09-04 NOTE — Telephone Encounter (Signed)
Pt cancelled apt for today at 2:30 with EG and needs to r/s. He would like an appointment card mailed.

## 2018-10-14 ENCOUNTER — Inpatient Hospital Stay (HOSPITAL_COMMUNITY): Payer: Medicare HMO

## 2018-10-14 ENCOUNTER — Other Ambulatory Visit: Payer: Self-pay

## 2018-10-14 ENCOUNTER — Emergency Department (HOSPITAL_COMMUNITY): Payer: Medicare HMO

## 2018-10-14 ENCOUNTER — Inpatient Hospital Stay (HOSPITAL_COMMUNITY)
Admission: EM | Admit: 2018-10-14 | Discharge: 2018-10-26 | DRG: 432 | Disposition: E | Payer: Medicare HMO | Attending: Internal Medicine | Admitting: Internal Medicine

## 2018-10-14 DIAGNOSIS — F1011 Alcohol abuse, in remission: Secondary | ICD-10-CM

## 2018-10-14 DIAGNOSIS — K7210 Chronic hepatic failure without coma: Secondary | ICD-10-CM

## 2018-10-14 DIAGNOSIS — N390 Urinary tract infection, site not specified: Secondary | ICD-10-CM | POA: Diagnosis present

## 2018-10-14 DIAGNOSIS — K819 Cholecystitis, unspecified: Secondary | ICD-10-CM

## 2018-10-14 DIAGNOSIS — N179 Acute kidney failure, unspecified: Secondary | ICD-10-CM | POA: Diagnosis present

## 2018-10-14 DIAGNOSIS — Z6830 Body mass index (BMI) 30.0-30.9, adult: Secondary | ICD-10-CM

## 2018-10-14 DIAGNOSIS — D631 Anemia in chronic kidney disease: Secondary | ICD-10-CM | POA: Diagnosis present

## 2018-10-14 DIAGNOSIS — R945 Abnormal results of liver function studies: Secondary | ICD-10-CM

## 2018-10-14 DIAGNOSIS — G894 Chronic pain syndrome: Secondary | ICD-10-CM | POA: Diagnosis present

## 2018-10-14 DIAGNOSIS — E876 Hypokalemia: Secondary | ICD-10-CM | POA: Diagnosis not present

## 2018-10-14 DIAGNOSIS — K0889 Other specified disorders of teeth and supporting structures: Secondary | ICD-10-CM

## 2018-10-14 DIAGNOSIS — N170 Acute kidney failure with tubular necrosis: Secondary | ICD-10-CM | POA: Diagnosis present

## 2018-10-14 DIAGNOSIS — B1710 Acute hepatitis C without hepatic coma: Secondary | ICD-10-CM | POA: Diagnosis present

## 2018-10-14 DIAGNOSIS — K7460 Unspecified cirrhosis of liver: Secondary | ICD-10-CM

## 2018-10-14 DIAGNOSIS — F1010 Alcohol abuse, uncomplicated: Secondary | ICD-10-CM | POA: Diagnosis present

## 2018-10-14 DIAGNOSIS — R4182 Altered mental status, unspecified: Secondary | ICD-10-CM | POA: Diagnosis present

## 2018-10-14 DIAGNOSIS — R6521 Severe sepsis with septic shock: Secondary | ICD-10-CM | POA: Diagnosis not present

## 2018-10-14 DIAGNOSIS — K759 Inflammatory liver disease, unspecified: Secondary | ICD-10-CM | POA: Diagnosis not present

## 2018-10-14 DIAGNOSIS — D689 Coagulation defect, unspecified: Secondary | ICD-10-CM | POA: Diagnosis present

## 2018-10-14 DIAGNOSIS — K219 Gastro-esophageal reflux disease without esophagitis: Secondary | ICD-10-CM | POA: Diagnosis present

## 2018-10-14 DIAGNOSIS — K828 Other specified diseases of gallbladder: Secondary | ICD-10-CM | POA: Diagnosis present

## 2018-10-14 DIAGNOSIS — Z888 Allergy status to other drugs, medicaments and biological substances status: Secondary | ICD-10-CM | POA: Diagnosis not present

## 2018-10-14 DIAGNOSIS — A0472 Enterocolitis due to Clostridium difficile, not specified as recurrent: Secondary | ICD-10-CM | POA: Diagnosis not present

## 2018-10-14 DIAGNOSIS — R339 Retention of urine, unspecified: Secondary | ICD-10-CM

## 2018-10-14 DIAGNOSIS — K7031 Alcoholic cirrhosis of liver with ascites: Secondary | ICD-10-CM | POA: Diagnosis present

## 2018-10-14 DIAGNOSIS — Z87442 Personal history of urinary calculi: Secondary | ICD-10-CM

## 2018-10-14 DIAGNOSIS — A414 Sepsis due to anaerobes: Secondary | ICD-10-CM | POA: Diagnosis not present

## 2018-10-14 DIAGNOSIS — K7581 Nonalcoholic steatohepatitis (NASH): Secondary | ICD-10-CM

## 2018-10-14 DIAGNOSIS — I5032 Chronic diastolic (congestive) heart failure: Secondary | ICD-10-CM | POA: Diagnosis present

## 2018-10-14 DIAGNOSIS — E1121 Type 2 diabetes mellitus with diabetic nephropathy: Secondary | ICD-10-CM | POA: Diagnosis not present

## 2018-10-14 DIAGNOSIS — K429 Umbilical hernia without obstruction or gangrene: Secondary | ICD-10-CM | POA: Diagnosis present

## 2018-10-14 DIAGNOSIS — G9341 Metabolic encephalopathy: Secondary | ICD-10-CM | POA: Diagnosis not present

## 2018-10-14 DIAGNOSIS — S0083XA Contusion of other part of head, initial encounter: Secondary | ICD-10-CM | POA: Diagnosis present

## 2018-10-14 DIAGNOSIS — K7200 Acute and subacute hepatic failure without coma: Secondary | ICD-10-CM

## 2018-10-14 DIAGNOSIS — R748 Abnormal levels of other serum enzymes: Secondary | ICD-10-CM

## 2018-10-14 DIAGNOSIS — N184 Chronic kidney disease, stage 4 (severe): Secondary | ICD-10-CM | POA: Diagnosis present

## 2018-10-14 DIAGNOSIS — K7040 Alcoholic hepatic failure without coma: Secondary | ICD-10-CM | POA: Diagnosis present

## 2018-10-14 DIAGNOSIS — J40 Bronchitis, not specified as acute or chronic: Secondary | ICD-10-CM

## 2018-10-14 DIAGNOSIS — Z515 Encounter for palliative care: Secondary | ICD-10-CM | POA: Diagnosis not present

## 2018-10-14 DIAGNOSIS — K567 Ileus, unspecified: Secondary | ICD-10-CM | POA: Diagnosis not present

## 2018-10-14 DIAGNOSIS — Z9181 History of falling: Secondary | ICD-10-CM

## 2018-10-14 DIAGNOSIS — N189 Chronic kidney disease, unspecified: Secondary | ICD-10-CM

## 2018-10-14 DIAGNOSIS — Z79891 Long term (current) use of opiate analgesic: Secondary | ICD-10-CM

## 2018-10-14 DIAGNOSIS — Z781 Physical restraint status: Secondary | ICD-10-CM

## 2018-10-14 DIAGNOSIS — Z7982 Long term (current) use of aspirin: Secondary | ICD-10-CM

## 2018-10-14 DIAGNOSIS — I110 Hypertensive heart disease with heart failure: Secondary | ICD-10-CM | POA: Diagnosis present

## 2018-10-14 DIAGNOSIS — I493 Ventricular premature depolarization: Secondary | ICD-10-CM | POA: Diagnosis present

## 2018-10-14 DIAGNOSIS — Z882 Allergy status to sulfonamides status: Secondary | ICD-10-CM

## 2018-10-14 DIAGNOSIS — E872 Acidosis: Secondary | ICD-10-CM

## 2018-10-14 DIAGNOSIS — W19XXXA Unspecified fall, initial encounter: Secondary | ICD-10-CM

## 2018-10-14 DIAGNOSIS — G92 Toxic encephalopathy: Secondary | ICD-10-CM | POA: Diagnosis present

## 2018-10-14 DIAGNOSIS — R7989 Other specified abnormal findings of blood chemistry: Secondary | ICD-10-CM

## 2018-10-14 DIAGNOSIS — Z66 Do not resuscitate: Secondary | ICD-10-CM | POA: Diagnosis not present

## 2018-10-14 DIAGNOSIS — Z79899 Other long term (current) drug therapy: Secondary | ICD-10-CM

## 2018-10-14 DIAGNOSIS — A419 Sepsis, unspecified organism: Secondary | ICD-10-CM | POA: Diagnosis not present

## 2018-10-14 DIAGNOSIS — J449 Chronic obstructive pulmonary disease, unspecified: Secondary | ICD-10-CM | POA: Diagnosis present

## 2018-10-14 DIAGNOSIS — K5900 Constipation, unspecified: Secondary | ICD-10-CM | POA: Diagnosis present

## 2018-10-14 DIAGNOSIS — R0602 Shortness of breath: Secondary | ICD-10-CM | POA: Diagnosis not present

## 2018-10-14 DIAGNOSIS — E1122 Type 2 diabetes mellitus with diabetic chronic kidney disease: Secondary | ICD-10-CM | POA: Diagnosis present

## 2018-10-14 DIAGNOSIS — W19XXXS Unspecified fall, sequela: Secondary | ICD-10-CM | POA: Diagnosis not present

## 2018-10-14 DIAGNOSIS — N200 Calculus of kidney: Secondary | ICD-10-CM | POA: Diagnosis present

## 2018-10-14 DIAGNOSIS — F329 Major depressive disorder, single episode, unspecified: Secondary | ICD-10-CM | POA: Diagnosis present

## 2018-10-14 DIAGNOSIS — Z4659 Encounter for fitting and adjustment of other gastrointestinal appliance and device: Secondary | ICD-10-CM

## 2018-10-14 DIAGNOSIS — W1830XA Fall on same level, unspecified, initial encounter: Secondary | ICD-10-CM | POA: Diagnosis present

## 2018-10-14 DIAGNOSIS — Z452 Encounter for adjustment and management of vascular access device: Secondary | ICD-10-CM

## 2018-10-14 DIAGNOSIS — R296 Repeated falls: Secondary | ICD-10-CM | POA: Diagnosis present

## 2018-10-14 DIAGNOSIS — Z794 Long term (current) use of insulin: Secondary | ICD-10-CM

## 2018-10-14 DIAGNOSIS — R768 Other specified abnormal immunological findings in serum: Secondary | ICD-10-CM

## 2018-10-14 DIAGNOSIS — F419 Anxiety disorder, unspecified: Secondary | ICD-10-CM | POA: Diagnosis present

## 2018-10-14 LAB — BRAIN NATRIURETIC PEPTIDE: B NATRIURETIC PEPTIDE 5: 88 pg/mL (ref 0.0–100.0)

## 2018-10-14 LAB — URINALYSIS, ROUTINE W REFLEX MICROSCOPIC
Bilirubin Urine: NEGATIVE
GLUCOSE, UA: NEGATIVE mg/dL
Ketones, ur: NEGATIVE mg/dL
Leukocytes, UA: NEGATIVE
Nitrite: NEGATIVE
Protein, ur: 30 mg/dL — AB
Specific Gravity, Urine: 1.011 (ref 1.005–1.030)
pH: 5 (ref 5.0–8.0)

## 2018-10-14 LAB — DIFFERENTIAL
Abs Immature Granulocytes: 0.3 10*3/uL — ABNORMAL HIGH (ref 0.00–0.07)
BASOS PCT: 0 %
Basophils Absolute: 0 10*3/uL (ref 0.0–0.1)
Eosinophils Absolute: 0 10*3/uL (ref 0.0–0.5)
Eosinophils Relative: 0 %
Immature Granulocytes: 2 %
Lymphocytes Relative: 3 %
Lymphs Abs: 0.5 10*3/uL — ABNORMAL LOW (ref 0.7–4.0)
Monocytes Absolute: 0.6 10*3/uL (ref 0.1–1.0)
Monocytes Relative: 4 %
Neutro Abs: 14.5 10*3/uL — ABNORMAL HIGH (ref 1.7–7.7)
Neutrophils Relative %: 91 %

## 2018-10-14 LAB — APTT: aPTT: 33 seconds (ref 24–36)

## 2018-10-14 LAB — COMPREHENSIVE METABOLIC PANEL
ALT: 800 U/L — ABNORMAL HIGH (ref 0–44)
ANION GAP: 20 — AB (ref 5–15)
AST: 1474 U/L — ABNORMAL HIGH (ref 15–41)
Albumin: 2.8 g/dL — ABNORMAL LOW (ref 3.5–5.0)
Alkaline Phosphatase: 911 U/L — ABNORMAL HIGH (ref 38–126)
BUN: 114 mg/dL — ABNORMAL HIGH (ref 8–23)
CHLORIDE: 90 mmol/L — AB (ref 98–111)
CO2: 20 mmol/L — ABNORMAL LOW (ref 22–32)
Calcium: 7.3 mg/dL — ABNORMAL LOW (ref 8.9–10.3)
Creatinine, Ser: 6.12 mg/dL — ABNORMAL HIGH (ref 0.61–1.24)
GFR calc Af Amer: 10 mL/min — ABNORMAL LOW (ref >60)
GFR calc non Af Amer: 9 mL/min — ABNORMAL LOW (ref >60)
GLUCOSE: 224 mg/dL — AB (ref 70–99)
Potassium: 3.7 mmol/L (ref 3.5–5.1)
Sodium: 130 mmol/L — ABNORMAL LOW (ref 135–145)
Total Bilirubin: 5.8 mg/dL — ABNORMAL HIGH (ref 0.3–1.2)
Total Protein: 7.5 g/dL (ref 6.5–8.1)

## 2018-10-14 LAB — CK: Total CK: 56 U/L (ref 49–397)

## 2018-10-14 LAB — I-STAT TROPONIN, ED: Troponin i, poc: 0.02 ng/mL (ref 0.00–0.08)

## 2018-10-14 LAB — PROTIME-INR
INR: 1.29
Prothrombin Time: 16 seconds — ABNORMAL HIGH (ref 11.4–15.2)

## 2018-10-14 LAB — CBC
HCT: 30.4 % — ABNORMAL LOW (ref 39.0–52.0)
Hemoglobin: 9.7 g/dL — ABNORMAL LOW (ref 13.0–17.0)
MCH: 27.7 pg (ref 26.0–34.0)
MCHC: 31.9 g/dL (ref 30.0–36.0)
MCV: 86.9 fL (ref 80.0–100.0)
Platelets: 233 10*3/uL (ref 150–400)
RBC: 3.5 MIL/uL — ABNORMAL LOW (ref 4.22–5.81)
RDW: 14.3 % (ref 11.5–15.5)
WBC: 15.9 10*3/uL — AB (ref 4.0–10.5)
nRBC: 0 % (ref 0.0–0.2)

## 2018-10-14 LAB — AMMONIA: Ammonia: 23 umol/L (ref 9–35)

## 2018-10-14 LAB — TSH: TSH: 0.268 u[IU]/mL — ABNORMAL LOW (ref 0.350–4.500)

## 2018-10-14 LAB — PHOSPHORUS: Phosphorus: 7.1 mg/dL — ABNORMAL HIGH (ref 2.5–4.6)

## 2018-10-14 LAB — I-STAT CG4 LACTIC ACID, ED
Lactic Acid, Venous: 0.63 mmol/L (ref 0.5–1.9)
Lactic Acid, Venous: 0.76 mmol/L (ref 0.5–1.9)

## 2018-10-14 LAB — MAGNESIUM: Magnesium: 2.6 mg/dL — ABNORMAL HIGH (ref 1.7–2.4)

## 2018-10-14 MED ORDER — SODIUM CHLORIDE 0.9 % IV SOLN
1.0000 g | Freq: Once | INTRAVENOUS | Status: AC
  Administered 2018-10-15: 1 g via INTRAVENOUS
  Filled 2018-10-14: qty 10

## 2018-10-14 MED ORDER — SODIUM CHLORIDE 0.9 % IV SOLN
1000.0000 mL | INTRAVENOUS | Status: DC
  Administered 2018-10-14 – 2018-10-15 (×2): 1000 mL via INTRAVENOUS

## 2018-10-14 MED ORDER — SODIUM CHLORIDE 0.9% FLUSH
3.0000 mL | Freq: Once | INTRAVENOUS | Status: AC
  Administered 2018-10-14: 3 mL via INTRAVENOUS

## 2018-10-14 MED ORDER — SODIUM CHLORIDE 0.9 % IV BOLUS (SEPSIS)
500.0000 mL | Freq: Once | INTRAVENOUS | Status: AC
  Administered 2018-10-14: 500 mL via INTRAVENOUS

## 2018-10-14 NOTE — ED Notes (Signed)
Patient transported to CT 

## 2018-10-14 NOTE — ED Triage Notes (Signed)
Patient states that he has been having multiple falls x3 weeks. States he feels like he is having a "seizure" in his leg. States has also been passing out.

## 2018-10-14 NOTE — ED Provider Notes (Signed)
[facility] EMERGENCY DEPARTMENT Provider Note   CSN: [id number] Arrival date & time: 10/19/2018  1950     History   Chief Complaint Chief Complaint  Patient presents with  . Fall    Multiple    HPI Javier Ho. is a 63 y.o. male.  HPI Patient reports he has been getting increasingly weak for the past 3 weeks.  He reports he is been feeling lightheaded.  He states that he started to fall frequently.  He reports over the past day now he is fallen at least 3 times.  He reports he did hit his head but did not get knocked out.  He denies an ongoing headache.  He reports he is feeling weak and shaky and that he is dropping things frequently.  He denies any fever that he is aware of.  He reported he has had a lot of coughing and bringing up mucus.  No chest pain.  No abdominal pain.  He reports he does feel achy and weak in the extremities.  He reports sometimes it feels like his legs will just twitch and jump as if they were having their own seizure.  Patient denies alcohol use.  He reports he has a very distant alcohol use history but not significantly heavy.  He denies smoking.  He reports he lives in a home where he lives a lot of other people live with him who help take care of him.  He denies he is had vomiting or diarrhea.  He reports he has been trying to keep up on taking fluids but really has not had much appetite. Past Medical History:  Diagnosis Date  . Anemia   . Chest pain   . CHF (congestive heart failure) ([place])   . Chronic bronchitis ([place])   . Chronic headaches   . CKD (chronic kidney disease)   . Depressive disorder, not elsewhere classified   . GERD (gastroesophageal reflux disease)   . HBP (high blood pressure)   . Laceration of face, multiple sites   . Lesion of lung   . Morbid obesity ([place])   . Multiple rib fractures   . Other diseases of lung, not elsewhere classified   . PNA (pneumonia)   . Sepsis ([place])   . Shortness of breath   .  Steatohepatitis   . Type II or unspecified type diabetes mellitus without mention of complication, uncontrolled   . Unspecified essential hypertension     Patient Active Problem List   Diagnosis Date Noted  . ARF (acute renal failure) ([place]) 10/01/2018  . Morbid obesity ([place]) 10/21/2016  . Chronic respiratory failure with hypoxia ([place]) 10/21/2016  . Chronic prescription benzodiazepine use 10/21/2016  . Chronic pain syndrome 10/21/2016  . Depression with anxiety 10/21/2016  . CKD (chronic kidney disease), stage III ([place]) 10/21/2016  . Encephalopathy 10/20/2016  . COPD (chronic obstructive pulmonary disease) ([place]) 10/20/2016  . Renal disease 10/20/2016  . Encephalopathy acute 10/20/2016  . Chronic use of opiate for therapeutic purpose 10/20/2016  . Diabetes mellitus with nephropathy ([place]) 10/20/2016  . Constipation 12/07/2014    Past Surgical History:  Procedure Laterality Date  . circumcision for Chronic Balanitis  2009  . COLONOSCOPY WITH ESOPHAGOGASTRODUODENOSCOPY (EGD)  04/08/2014  . cystoscopic left Ureteral stone removal  1996  . LUMBAR LAMINECTOMY  1976  . NEPHROSTOMY    . small intestine endoscopy  04/08/2014        Home Medications    Prior to Admission medications  Medication Sig Start Date End Date Taking? Authorizing Provider  albuterol (PROVENTIL,VENTOLIN) 90 MCG/ACT inhaler Inhale 2 puffs into the lungs every 6 (six) hours as needed for wheezing or shortness of breath.    Yes [provider]  ALPRAZolam Duanne Moron) 1 MG tablet Take 0.5 tablets (0.5 mg total) by mouth 2 (two) times daily as needed for anxiety. 10/21/16  Yes Rexene Alberts, MD  amitriptyline (ELAVIL) 50 MG tablet Take 1 tablet (50 mg total) by mouth at bedtime. Patient taking differently: Take 100 mg by mouth at bedtime.  10/21/16  Yes Rexene Alberts, MD  aspirin 81 MG chewable tablet Chew 81 mg by mouth daily.   Yes [provider]  furosemide (LASIX) 40 MG tablet Take 1 tablet by  mouth daily. 10/06/16  Yes [provider]  gabapentin (NEURONTIN) 300 MG capsule Take 600 mg by mouth 2 (two) times daily.    Yes [provider]  INCRUSE ELLIPTA 62.5 MCG/INH AEPB Inhale 1 puff into the lungs daily. 08/31/16  Yes [provider]  insulin glargine (LANTUS) 100 UNIT/ML injection Inject 0.5 mLs (50 Units total) into the skin 3 (three) times daily. DO NOT TAKE INSULIN IF YOUR BLOOD SUGAR IS LESS THAN 120. Patient taking differently: Inject 70 Units into the skin 3 (three) times daily. DO NOT TAKE INSULIN IF YOUR BLOOD SUGAR IS LESS THAN 120. 10/21/16  Yes Rexene Alberts, MD  metoCLOPramide (REGLAN) 10 MG tablet Take 1 tablet (10 mg total) by mouth 2 (two) times daily. Before meals and at bedtime Patient taking differently: Take 10 mg by mouth every 6 (six) hours as needed for nausea or vomiting.  10/21/16  Yes Rexene Alberts, MD  Multiple Vitamin (MULTIVITAMIN) tablet Take 1 tablet by mouth daily.   Yes [provider]  oxyCODONE-acetaminophen (PERCOCET) 10-325 MG tablet Take 0.5 tablets by mouth every 4 (four) hours as needed for pain. Patient taking differently: Take 1 tablet by mouth 5 (five) times daily.  10/21/16  Yes Rexene Alberts, MD  pantoprazole (PROTONIX) 40 MG tablet Take 40 mg by mouth daily. 11/11/17  Yes [provider]  PARoxetine (PAXIL) 40 MG tablet Take 40 mg by mouth every morning.     Yes [provider]  tamsulosin (FLOMAX) 0.4 MG CAPS capsule Take 1 capsule by mouth daily. 10/01/18  Yes [provider]    Family History Family History  Problem Relation Age of Onset  . Colon cancer Neg Hx     Social History Social History   Tobacco Use  . Smoking status: Never Smoker  . Smokeless tobacco: Never Used  Substance Use Topics  . Alcohol use: No    Alcohol/week: 0.0 standard drinks  . Drug use: No     Allergies   Sulfa antibiotics and Meperidine   Review of Systems Review of Systems 10  Systems reviewed and are negative for acute change except as noted in the HPI.   Physical Exam Updated Vital Signs BP 115/81   Pulse 96   Temp 97.9 F (36.6 C) (Oral)   Resp (!) 22   Ht 6' 1"  (1.854 m)   Wt 110.2 kg   SpO2 97%   BMI 32.06 kg/m   Physical Exam Constitutional:      Comments: Patient is deconditioned in appearance.  He is resting but awakens to have clear mental status.  Slightly pale.  No respiratory distress at rest.  Frequent wet cough.  HENT:     Head:  Comments: Linear abrasion to the forehead.  No hematoma.    Nose: Nose normal.     Mouth/Throat:     Comments: Dukas membranes are very dry.  Patient is edentulous. Eyes:     Extraocular Movements: Extraocular movements intact.     Pupils: Pupils are equal, round, and reactive to light.  Neck:     Musculoskeletal: Neck supple.  Cardiovascular:     Rate and Rhythm: Regular rhythm.     Pulses: Normal pulses.     Comments: Borderline tachycardia. Pulmonary:     Comments: Patient does not have respiratory distress at rest.  He however has frequent wet sounding cough.  Rhonchi in the lung fields.  Decreased breath sounds at the bases.  Occasional crackles. Abdominal:     Comments: Abdomen is distended and soft and nontender.  Musculoskeletal:     Comments: No significant peripheral edema.  No deformities of extremities.  Calves are soft and nontender.  Skin:    General: Skin is warm and dry.     Coloration: Skin is pale.  Neurological:     Comments: Patient is alert with oriented mental status.  He follows commands for movement of extremities with symmetric use.  He however has limitations due to generalized weakness and body habitus.  No focal motor deficit.  Patient has occasional myoclonic jerk.  Psychiatric:        Mood and Affect: Mood normal.      ED Treatments / Results  Labs (all labs ordered are listed, but only abnormal results are displayed) Labs Reviewed  PROTIME-INR - Abnormal;  Notable for the following components:      Result Value   Prothrombin Time 16.0 (*)    All other components within normal limits  CBC - Abnormal; Notable for the following components:   WBC 15.9 (*)    RBC 3.50 (*)    Hemoglobin 9.7 (*)    HCT 30.4 (*)    All other components within normal limits  DIFFERENTIAL - Abnormal; Notable for the following components:   Neutro Abs 14.5 (*)    Lymphs Abs 0.5 (*)    Abs Immature Granulocytes 0.30 (*)    All other components within normal limits  COMPREHENSIVE METABOLIC PANEL - Abnormal; Notable for the following components:   Sodium 130 (*)    Chloride 90 (*)    CO2 20 (*)    Glucose, Bld 224 (*)    BUN 114 (*)    Creatinine, Ser 6.12 (*)    Calcium 7.3 (*)    Albumin 2.8 (*)    AST 1,474 (*)    ALT 800 (*)    Alkaline Phosphatase 911 (*)    Total Bilirubin 5.8 (*)    GFR calc non Af Amer 9 (*)    GFR calc Af Amer 10 (*)    Anion gap 20 (*)    All other components within normal limits  URINALYSIS, ROUTINE W REFLEX MICROSCOPIC - Abnormal; Notable for the following components:   Color, Urine AMBER (*)    APPearance HAZY (*)    Hgb urine dipstick SMALL (*)    Protein, ur 30 (*)    Bacteria, UA RARE (*)    All other components within normal limits  TSH - Abnormal; Notable for the following components:   TSH 0.268 (*)    All other components within normal limits  MAGNESIUM - Abnormal; Notable for the following components:   Magnesium 2.6 (*)    All other  components within normal limits  PHOSPHORUS - Abnormal; Notable for the following components:   Phosphorus 7.1 (*)    All other components within normal limits  CULTURE, BLOOD (ROUTINE X 2)  CULTURE, BLOOD (ROUTINE X 2)  URINE CULTURE  APTT  CK  BRAIN NATRIURETIC PEPTIDE  AMMONIA  ACETAMINOPHEN LEVEL  HEPATITIS PANEL, ACUTE  SALICYLATE LEVEL  I-STAT TROPONIN, ED  I-STAT CG4 LACTIC ACID, ED  I-STAT CG4 LACTIC ACID, ED  TYPE AND SCREEN    EKG EKG will not import from  MUSE.  No significant change except frequent PVCs.  Sinus rhythm.  Radiology Ct Head Wo Contrast  Result Date: 10/02/2018 CLINICAL DATA:  Multiple falls over the past 3 weeks. EXAM: CT HEAD WITHOUT CONTRAST TECHNIQUE: Contiguous axial images were obtained from the base of the skull through the vertex without intravenous contrast. COMPARISON:  Head CT and brain MRI 08/26/2018. FINDINGS: Brain: No evidence of acute infarction, hemorrhage, hydrocephalus, extra-axial collection or mass lesion/mass effect. There is some cortical atrophy. Vascular: No hyperdense vessel or unexpected calcification. Skull: Intact.  No focal lesion. Sinuses/Orbits: Negative. Other: None. IMPRESSION: No acute abnormality. Mild cortical atrophy. Electronically Signed   By: Inge Rise M.D.   On: 10/13/2018 20:29   Dg Chest Port 1 View  Result Date: 10/18/2018 CLINICAL DATA:  Cough, short of breath EXAM: PORTABLE CHEST 1 VIEW COMPARISON:  10/10/2018, 08/26/2018 FINDINGS: Elevation of left diaphragm. Linear scarring or atelectasis at the right base. No consolidation or effusion. Stable cardiomediastinal silhouette. No pneumothorax. IMPRESSION: No active disease. Stable elevation of left diaphragm with basilar atelectasis or scarring Electronically Signed   By: Donavan Foil M.D.   On: 10/25/2018 21:59    Procedures Procedures (including critical care time)  Medications Ordered in ED Medications  sodium chloride 0.9 % bolus 500 mL (0 mLs Intravenous Stopped 10/08/2018 2311)    Followed by  0.9 %  sodium chloride infusion (1,000 mLs Intravenous New Bag/Given 10/19/2018 2224)  cefTRIAXone (ROCEPHIN) 1 g in sodium chloride 0.9 % 100 mL IVPB (has no administration in time range)  sodium chloride flush (NS) 0.9 % injection 3 mL (3 mLs Intravenous Given 09/29/2018 2224)     Initial Impression / Assessment and Plan / ED Course  I have reviewed the triage vital signs and the nursing notes.  Pertinent labs & imaging results that  were available during my care of the patient were reviewed by me and considered in my medical decision making (see chart for details).    Patient presents with weakness and frequent falls.  He denies abdominal pain or vomiting.  He reports he is trying to take in liquids.  He denies alcohol use.  He reports he quit drinking 8 months ago but does not endorse heavy alcohol use historically.  Patient has multiple medical problems today.  He has severe worsening of pre-existing renal failure, hepatitis of uncertain etiology (patient is not endorsing abdominal pain to palpation nor frequent vomiting or abdominal pain), and generalized weakness with falls.  Rocephin has been started for urinary tract infection.  Foley catheter placed for urinary retention.  Patient will be admitted to hospitalist service for continued diagnostic and therapeutic management.  Final Clinical Impressions(s) / ED Diagnoses   Final diagnoses:  Acute renal failure superimposed on chronic kidney disease, unspecified CKD stage, unspecified acute renal failure type ([place])  Frequent falls  Bronchitis  Urinary retention  Lower urinary tract infectious disease  Hepatitis    ED Discharge Orders  None       Charlesetta Shanks, MD 10/16/2018 [phone]

## 2018-10-15 ENCOUNTER — Other Ambulatory Visit (HOSPITAL_COMMUNITY): Payer: Medicare HMO

## 2018-10-15 ENCOUNTER — Inpatient Hospital Stay (HOSPITAL_COMMUNITY): Payer: Medicare HMO

## 2018-10-15 ENCOUNTER — Encounter (HOSPITAL_COMMUNITY): Payer: Self-pay | Admitting: Internal Medicine

## 2018-10-15 DIAGNOSIS — N179 Acute kidney failure, unspecified: Secondary | ICD-10-CM

## 2018-10-15 DIAGNOSIS — N390 Urinary tract infection, site not specified: Secondary | ICD-10-CM

## 2018-10-15 DIAGNOSIS — E1121 Type 2 diabetes mellitus with diabetic nephropathy: Secondary | ICD-10-CM

## 2018-10-15 DIAGNOSIS — R339 Retention of urine, unspecified: Secondary | ICD-10-CM

## 2018-10-15 DIAGNOSIS — R296 Repeated falls: Secondary | ICD-10-CM

## 2018-10-15 DIAGNOSIS — J40 Bronchitis, not specified as acute or chronic: Secondary | ICD-10-CM

## 2018-10-15 DIAGNOSIS — R0602 Shortness of breath: Secondary | ICD-10-CM

## 2018-10-15 DIAGNOSIS — N189 Chronic kidney disease, unspecified: Secondary | ICD-10-CM

## 2018-10-15 DIAGNOSIS — K759 Inflammatory liver disease, unspecified: Secondary | ICD-10-CM

## 2018-10-15 LAB — SALICYLATE LEVEL: Salicylate Lvl: <7 mg/dL (ref 2.8–30.0)

## 2018-10-15 LAB — CBC WITH DIFFERENTIAL/PLATELET
Abs Immature Granulocytes: 0.3 10*3/uL — ABNORMAL HIGH (ref 0.00–0.07)
Basophils Absolute: 0 10*3/uL (ref 0.0–0.1)
Basophils Relative: 0 %
EOS PCT: 0 %
Eosinophils Absolute: 0 10*3/uL (ref 0.0–0.5)
HCT: 28.1 % — ABNORMAL LOW (ref 39.0–52.0)
Hemoglobin: 9.2 g/dL — ABNORMAL LOW (ref 13.0–17.0)
Immature Granulocytes: 2 %
Lymphocytes Relative: 3 %
Lymphs Abs: 0.5 10*3/uL — ABNORMAL LOW (ref 0.7–4.0)
MCH: 27.9 pg (ref 26.0–34.0)
MCHC: 32.7 g/dL (ref 30.0–36.0)
MCV: 85.2 fL (ref 80.0–100.0)
Monocytes Absolute: 0.5 10*3/uL (ref 0.1–1.0)
Monocytes Relative: 3 %
Neutro Abs: 13.5 10*3/uL — ABNORMAL HIGH (ref 1.7–7.7)
Neutrophils Relative %: 92 %
Platelets: 210 10*3/uL (ref 150–400)
RBC: 3.3 MIL/uL — ABNORMAL LOW (ref 4.22–5.81)
RDW: 14.4 % (ref 11.5–15.5)
WBC: 14.8 10*3/uL — ABNORMAL HIGH (ref 4.0–10.5)
nRBC: 0 % (ref 0.0–0.2)

## 2018-10-15 LAB — DIC (DISSEMINATED INTRAVASCULAR COAGULATION) PANEL: FIBRINOGEN: 742 mg/dL — AB (ref 210–475)

## 2018-10-15 LAB — ECHOCARDIOGRAM COMPLETE
Height: 73 in
Weight: 3735.47 oz

## 2018-10-15 LAB — HIV ANTIBODY (ROUTINE TESTING W REFLEX): HIV Screen 4th Generation wRfx: NONREACTIVE

## 2018-10-15 LAB — BASIC METABOLIC PANEL
Anion gap: 21 — ABNORMAL HIGH (ref 5–15)
BUN: 121 mg/dL — ABNORMAL HIGH (ref 8–23)
CO2: 15 mmol/L — ABNORMAL LOW (ref 22–32)
CREATININE: 6.1 mg/dL — AB (ref 0.61–1.24)
Calcium: 6.9 mg/dL — ABNORMAL LOW (ref 8.9–10.3)
Chloride: 95 mmol/L — ABNORMAL LOW (ref 98–111)
GFR calc Af Amer: 10 mL/min — ABNORMAL LOW (ref >60)
GFR, EST NON AFRICAN AMERICAN: 9 mL/min — AB (ref >60)
Glucose, Bld: 168 mg/dL — ABNORMAL HIGH (ref 70–99)
Potassium: 4 mmol/L (ref 3.5–5.1)
Sodium: 131 mmol/L — ABNORMAL LOW (ref 135–145)

## 2018-10-15 LAB — IRON AND TIBC
Iron: 36 ug/dL — ABNORMAL LOW (ref 45–182)
Saturation Ratios: 17 % — ABNORMAL LOW (ref 17.9–39.5)
TIBC: 216 ug/dL — ABNORMAL LOW (ref 250–450)
UIBC: 180 ug/dL

## 2018-10-15 LAB — TYPE AND SCREEN
ABO/RH(D): O POS
Antibody Screen: NEGATIVE

## 2018-10-15 LAB — DIC (DISSEMINATED INTRAVASCULAR COAGULATION)PANEL
D-Dimer, Quant: 5.41 ug/mL-FEU — ABNORMAL HIGH (ref 0.00–0.50)
INR: 1.34
Platelets: 212 10*3/uL (ref 150–400)
Prothrombin Time: 16.5 seconds — ABNORMAL HIGH (ref 11.4–15.2)
Smear Review: NONE SEEN
aPTT: 36 seconds (ref 24–36)

## 2018-10-15 LAB — FERRITIN: Ferritin: 984 ng/mL — ABNORMAL HIGH (ref 24–336)

## 2018-10-15 LAB — GLUCOSE, CAPILLARY
Glucose-Capillary: 170 mg/dL — ABNORMAL HIGH (ref 70–99)
Glucose-Capillary: 134 mg/dL — ABNORMAL HIGH (ref 70–99)
Glucose-Capillary: 104 mg/dL — ABNORMAL HIGH (ref 70–99)
Glucose-Capillary: 110 mg/dL — ABNORMAL HIGH (ref 70–99)

## 2018-10-15 LAB — TROPONIN I
Troponin I: 0.06 ng/mL (ref <0.03)
Troponin I: 0.05 ng/mL (ref <0.03)
Troponin I: 0.04 ng/mL (ref <0.03)

## 2018-10-15 LAB — CK TOTAL AND CKMB (NOT AT ARMC)
CK, MB: 1.9 ng/mL (ref 0.5–5.0)
Relative Index: INVALID (ref 0.0–2.5)
Total CK: 58 U/L (ref 49–397)

## 2018-10-15 LAB — HEPATIC FUNCTION PANEL
ALT: 737 U/L — ABNORMAL HIGH (ref 0–44)
AST: 1475 U/L — ABNORMAL HIGH (ref 15–41)
Albumin: 2.3 g/dL — ABNORMAL LOW (ref 3.5–5.0)
Alkaline Phosphatase: 890 U/L — ABNORMAL HIGH (ref 38–126)
BILIRUBIN DIRECT: 3.8 mg/dL — AB (ref 0.0–0.2)
BILIRUBIN INDIRECT: 1.8 mg/dL — AB (ref 0.3–0.9)
Total Bilirubin: 5.6 mg/dL — ABNORMAL HIGH (ref 0.3–1.2)
Total Protein: 6.4 g/dL — ABNORMAL LOW (ref 6.5–8.1)

## 2018-10-15 LAB — ABO/RH: ABO/RH(D): O POS

## 2018-10-15 LAB — HEMOGLOBIN A1C
Hgb A1c MFr Bld: 7.7 % — ABNORMAL HIGH (ref 4.8–5.6)
MEAN PLASMA GLUCOSE: 174.29 mg/dL

## 2018-10-15 LAB — ACETAMINOPHEN LEVEL: Acetaminophen (Tylenol), Serum: <10 ug/mL — ABNORMAL LOW (ref 10–30)

## 2018-10-15 MED ORDER — HYDRALAZINE HCL 20 MG/ML IJ SOLN: 5.0000 mg | INTRAMUSCULAR | Status: DC | PRN

## 2018-10-15 MED ORDER — LORAZEPAM 2 MG/ML IJ SOLN
1.0000 mg | Freq: Once | INTRAMUSCULAR | Status: AC
  Administered 2018-10-15: 1 mg via INTRAVENOUS
  Filled 2018-10-15: qty 1

## 2018-10-15 MED ORDER — IPRATROPIUM-ALBUTEROL 0.5-2.5 (3) MG/3ML IN SOLN: 3.0000 mL | RESPIRATORY_TRACT | Status: DC | PRN

## 2018-10-15 MED ORDER — SORBITOL 70 % SOLN
960.0000 mL | TOPICAL_OIL | Freq: Once | ORAL | Status: AC
  Administered 2018-10-15: 960 mL via RECTAL
  Filled 2018-10-15: qty 473

## 2018-10-15 MED ORDER — STERILE WATER FOR INJECTION IV SOLN
INTRAVENOUS | Status: DC
  Administered 2018-10-15 – 2018-10-18 (×7): via INTRAVENOUS
  Filled 2018-10-15 (×19): qty 850

## 2018-10-15 MED ORDER — ONDANSETRON HCL 4 MG PO TABS: 4.0000 mg | ORAL_TABLET | Freq: Four times a day (QID) | ORAL | Status: DC | PRN

## 2018-10-15 MED ORDER — ONDANSETRON HCL 4 MG/2ML IJ SOLN: 4.0000 mg | Freq: Four times a day (QID) | INTRAMUSCULAR | Status: DC | PRN

## 2018-10-15 MED ORDER — IPRATROPIUM-ALBUTEROL 0.5-2.5 (3) MG/3ML IN SOLN
3.0000 mL | RESPIRATORY_TRACT | Status: DC
  Administered 2018-10-15: 3 mL via RESPIRATORY_TRACT
  Filled 2018-10-15: qty 3

## 2018-10-15 MED ORDER — PIPERACILLIN-TAZOBACTAM IN DEX 2-0.25 GM/50ML IV SOLN
2.2500 g | Freq: Three times a day (TID) | INTRAVENOUS | Status: DC
  Administered 2018-10-15 – 2018-10-17 (×7): 2.25 g via INTRAVENOUS
  Filled 2018-10-15 (×8): qty 50

## 2018-10-15 MED ORDER — INSULIN ASPART 100 UNIT/ML ~~LOC~~ SOLN
0.0000 [IU] | SUBCUTANEOUS | Status: DC
  Administered 2018-10-15: 1 [IU] via SUBCUTANEOUS
  Administered 2018-10-16 (×2): 2 [IU] via SUBCUTANEOUS
  Administered 2018-10-16: 1 [IU] via SUBCUTANEOUS
  Administered 2018-10-17: 2 [IU] via SUBCUTANEOUS
  Administered 2018-10-17 (×2): 1 [IU] via SUBCUTANEOUS
  Administered 2018-10-17: 2 [IU] via SUBCUTANEOUS
  Administered 2018-10-17: 1 [IU] via SUBCUTANEOUS
  Administered 2018-10-17: 2 [IU] via SUBCUTANEOUS
  Administered 2018-10-18: 1 [IU] via SUBCUTANEOUS
  Administered 2018-10-18 – 2018-10-19 (×5): 2 [IU] via SUBCUTANEOUS
  Administered 2018-10-19 – 2018-10-20 (×3): 1 [IU] via SUBCUTANEOUS
  Administered 2018-10-21: 2 [IU] via SUBCUTANEOUS
  Administered 2018-10-21: 1 [IU] via SUBCUTANEOUS
  Administered 2018-10-21: 2 [IU] via SUBCUTANEOUS
  Administered 2018-10-21: 1 [IU] via SUBCUTANEOUS
  Administered 2018-10-22 (×2): 2 [IU] via SUBCUTANEOUS

## 2018-10-15 MED ORDER — BUDESONIDE 0.25 MG/2ML IN SUSP
0.2500 mg | Freq: Two times a day (BID) | RESPIRATORY_TRACT | Status: DC
  Administered 2018-10-15 – 2018-10-22 (×14): 0.25 mg via RESPIRATORY_TRACT
  Filled 2018-10-15 (×15): qty 2

## 2018-10-15 MED ORDER — SODIUM CHLORIDE 0.9 % IV SOLN: INTRAVENOUS | Status: DC

## 2018-10-15 MED ORDER — TECHNETIUM TC 99M MEBROFENIN IV KIT
5.0000 | PACK | Freq: Once | INTRAVENOUS | Status: AC | PRN
  Administered 2018-10-15: 5 via INTRAVENOUS

## 2018-10-15 MED ORDER — IPRATROPIUM-ALBUTEROL 0.5-2.5 (3) MG/3ML IN SOLN
3.0000 mL | Freq: Three times a day (TID) | RESPIRATORY_TRACT | Status: DC
  Administered 2018-10-15 – 2018-10-16 (×3): 3 mL via RESPIRATORY_TRACT
  Filled 2018-10-15 (×4): qty 3

## 2018-10-15 NOTE — Progress Notes (Signed)
  Echocardiogram 2D Echocardiogram has been performed.  Jennette Dubin 10/15/2018, 3:11 PM

## 2018-10-15 NOTE — Plan of Care (Signed)
  Problem: Clinical Measurements: Goal: Respiratory complications will improve Outcome: Progressing Note:  No s/s of respiratory complications noted.  Stable on 2L/[place]. Goal: Cardiovascular complication will be avoided Outcome: Progressing Note:  No s/s of cardiovascular complication noted.  NSR on telemetry.   Problem: Safety: Goal: Ability to remain free from injury will improve Outcome: Progressing Note:  Bed alarm on; call light, phone, and personal items within reach.  Pt agrees to call staff for assistance getting out of bed.

## 2018-10-15 NOTE — Consult Note (Signed)
[facility] Consult Note  Javier Ho. 01-14-1956  [id number].    Requesting MD: Wendee Beavers Chief Complaint/Reason for Consult: ?acute cholecystitis  HPI:  Javier Ho. is a 63yo male PMH COPD, diastolic CHF, CKD-IV, chronic anemia, DM2, anxiety and depression admitted to [facility] yesterday apparently complaining of 3 weeks of worsening weakness and frequent falls. Patient is a poor historian and difficult to understand, therefore the majority of his history was taken from the chart.  Per chart he was not complaining of any abdominal pain, nausea, or vomiting at presentation in the ED. Workup included CT renal stone that showed a distended gallbladder with findings concerning for acute cholecystitis; severe constipation with possible fecal impaction within the rectum. AST 1474, ALT 800, alk phos 911, Tbili 5.8, WBC 15.9, creatinine 6.12. Acetaminophen level <10. Hepatitis panel pending.  HIDA scan performed and suggests biliary obstruction, but no definite acute cholecystitis. MRCP attempted this morning but patient unable to sit still long enough for exam.   ROS: unable to fully assess due to patient's mental status   Family History  Problem Relation Age of Onset  . Colon cancer Neg Hx     Past Medical History:  Diagnosis Date  . Anemia   . Chest pain   . CHF (congestive heart failure) ([place])   . Chronic bronchitis ([place])   . Chronic headaches   . CKD (chronic kidney disease)   . Depressive disorder, not elsewhere classified   . GERD (gastroesophageal reflux disease)   . HBP (high blood pressure)   . Laceration of face, multiple sites   . Lesion of lung   . Morbid obesity ([place])   . Multiple rib fractures   . Other diseases of lung, not elsewhere classified   . PNA (pneumonia)   . Sepsis ([place])   . Shortness of breath   . Steatohepatitis   . Type II or unspecified type diabetes mellitus without mention of complication, uncontrolled   . Unspecified  essential hypertension     Past Surgical History:  Procedure Laterality Date  . circumcision for Chronic Balanitis  2009  . COLONOSCOPY WITH ESOPHAGOGASTRODUODENOSCOPY (EGD)  04/08/2014  . cystoscopic left Ureteral stone removal  1996  . LUMBAR LAMINECTOMY  1976  . NEPHROSTOMY    . small intestine endoscopy  04/08/2014    Social History:  reports that he has never smoked. He has never used smokeless tobacco. He reports that he does not drink alcohol or use drugs.  Allergies:  Allergies  Allergen Reactions  . Sulfa Antibiotics Nausea And Vomiting    unknown  . Meperidine Other (See Comments)    "makes me crazy"    Medications Prior to Admission  Medication Sig Dispense Refill  . albuterol (PROVENTIL,VENTOLIN) 90 MCG/ACT inhaler Inhale 2 puffs into the lungs every 6 (six) hours as needed for wheezing or shortness of breath.     . ALPRAZolam (XANAX) 1 MG tablet Take 0.5 tablets (0.5 mg total) by mouth 2 (two) times daily as needed for anxiety.    Marland Kitchen amitriptyline (ELAVIL) 50 MG tablet Take 1 tablet (50 mg total) by mouth at bedtime. (Patient taking differently: Take 100 mg by mouth at bedtime. )    . aspirin 81 MG chewable tablet Chew 81 mg by mouth daily.    . furosemide (LASIX) 40 MG tablet Take 1 tablet by mouth daily.    Marland Kitchen gabapentin (NEURONTIN) 300 MG capsule Take 600 mg by mouth 2 (two) times daily.     Marland Kitchen  INCRUSE ELLIPTA 62.5 MCG/INH AEPB Inhale 1 puff into the lungs daily.    . insulin glargine (LANTUS) 100 UNIT/ML injection Inject 0.5 mLs (50 Units total) into the skin 3 (three) times daily. DO NOT TAKE INSULIN IF YOUR BLOOD SUGAR IS LESS THAN 120. (Patient taking differently: Inject 70 Units into the skin 3 (three) times daily. DO NOT TAKE INSULIN IF YOUR BLOOD SUGAR IS LESS THAN 120.)    . metoCLOPramide (REGLAN) 10 MG tablet Take 1 tablet (10 mg total) by mouth 2 (two) times daily. Before meals and at bedtime (Patient taking differently: Take 10 mg by mouth every 6 (six)  hours as needed for nausea or vomiting. )    . Multiple Vitamin (MULTIVITAMIN) tablet Take 1 tablet by mouth daily.    Marland Kitchen oxyCODONE-acetaminophen (PERCOCET) 10-325 MG tablet Take 0.5 tablets by mouth every 4 (four) hours as needed for pain. (Patient taking differently: Take 1 tablet by mouth 5 (five) times daily. )    . pantoprazole (PROTONIX) 40 MG tablet Take 40 mg by mouth daily.    Marland Kitchen PARoxetine (PAXIL) 40 MG tablet Take 40 mg by mouth every morning.      . tamsulosin (FLOMAX) 0.4 MG CAPS capsule Take 1 capsule by mouth daily.      Prior to Admission medications   Medication Sig Start Date End Date Taking? Authorizing Provider  albuterol (PROVENTIL,VENTOLIN) 90 MCG/ACT inhaler Inhale 2 puffs into the lungs every 6 (six) hours as needed for wheezing or shortness of breath.    Yes [provider]  ALPRAZolam Duanne Moron) 1 MG tablet Take 0.5 tablets (0.5 mg total) by mouth 2 (two) times daily as needed for anxiety. 10/21/16  Yes Rexene Alberts, MD  amitriptyline (ELAVIL) 50 MG tablet Take 1 tablet (50 mg total) by mouth at bedtime. Patient taking differently: Take 100 mg by mouth at bedtime.  10/21/16  Yes Rexene Alberts, MD  aspirin 81 MG chewable tablet Chew 81 mg by mouth daily.   Yes [provider]  furosemide (LASIX) 40 MG tablet Take 1 tablet by mouth daily. 10/06/16  Yes [provider]  gabapentin (NEURONTIN) 300 MG capsule Take 600 mg by mouth 2 (two) times daily.    Yes [provider]  INCRUSE ELLIPTA 62.5 MCG/INH AEPB Inhale 1 puff into the lungs daily. 08/31/16  Yes [provider]  insulin glargine (LANTUS) 100 UNIT/ML injection Inject 0.5 mLs (50 Units total) into the skin 3 (three) times daily. DO NOT TAKE INSULIN IF YOUR BLOOD SUGAR IS LESS THAN 120. Patient taking differently: Inject 70 Units into the skin 3 (three) times daily. DO NOT TAKE INSULIN IF YOUR BLOOD SUGAR IS LESS THAN 120. 10/21/16  Yes Rexene Alberts, MD  metoCLOPramide (REGLAN)  10 MG tablet Take 1 tablet (10 mg total) by mouth 2 (two) times daily. Before meals and at bedtime Patient taking differently: Take 10 mg by mouth every 6 (six) hours as needed for nausea or vomiting.  10/21/16  Yes Rexene Alberts, MD  Multiple Vitamin (MULTIVITAMIN) tablet Take 1 tablet by mouth daily.   Yes [provider]  oxyCODONE-acetaminophen (PERCOCET) 10-325 MG tablet Take 0.5 tablets by mouth every 4 (four) hours as needed for pain. Patient taking differently: Take 1 tablet by mouth 5 (five) times daily.  10/21/16  Yes Rexene Alberts, MD  pantoprazole (PROTONIX) 40 MG tablet Take 40 mg by mouth daily. 11/11/17  Yes [provider]  PARoxetine (PAXIL) 40 MG tablet Take 40 mg by  mouth every morning.     Yes [provider]  tamsulosin (FLOMAX) 0.4 MG CAPS capsule Take 1 capsule by mouth daily. 10/01/18  Yes [provider]    Blood pressure 116/71, pulse 93, temperature 97.9 F (36.6 C), temperature source Oral, resp. rate 16, height 6' 1"  (1.854 m), weight 105.9 kg, SpO2 96 %. Physical Exam: General: WD/WN white male who is laying in bed in NAD HEENT: head is normocephalic, atraumatic.  Sclera are noninjected.  Pupils equal and round.  Ears and nose without any masses or lesions.  Mouth is pink and moist. Dentition poor Heart: regular, rate, and rhythm.  No obvious murmurs, gallops, or rubs noted.  Palpable pedal pulses bilaterally Lungs: diffuse rhonchi, no wheezes or rales noted.  Respiratory effort nonlabored Abd: protuberant abdomen, soft, nontender, +BS, no masses or organomegaly. Soft reducible umbilical hernia MS: all 4 extremities are symmetrical with no cyanosis, clubbing, or edema. Skin: warm and dry Psych: alert, unable to fully assess Neuro: cranial nerves grossly intact, extremity CSM intact bilaterally, mumbling speech  Results for orders placed or performed during the hospital encounter of 10/19/2018 (from the past 48 hour(s))  Protime-INR      Status: Abnormal   Collection Time: 10/11/2018  7:57 PM  Result Value Ref Range   Prothrombin Time 16.0 (H) 11.4 - 15.2 seconds   INR 1.29     Comment: Performed at [facility], [address] [place], [place] [address]  APTT     Status: None   Collection Time: 09/26/2018  7:57 PM  Result Value Ref Range   aPTT 33 24 - 36 seconds    Comment: Performed at [facility] [address], [place], [place] [address]  CBC     Status: Abnormal   Collection Time: 10/08/2018  7:57 PM  Result Value Ref Range   WBC 15.9 (H) 4.0 - 10.5 K/uL   RBC 3.50 (L) 4.22 - 5.81 MIL/uL   Hemoglobin 9.7 (L) 13.0 - 17.0 g/dL   HCT 30.4 (L) 39.0 - 52.0 %   MCV 86.9 80.0 - 100.0 fL   MCH 27.7 26.0 - 34.0 pg   MCHC 31.9 30.0 - 36.0 g/dL   RDW 14.3 11.5 - 15.5 %   Platelets 233 150 - 400 K/uL   nRBC 0.0 0.0 - 0.2 %    Comment: Performed at [facility], [place] [address] [place], [place] [address]  Differential     Status: Abnormal   Collection Time: 10/11/2018  7:57 PM  Result Value Ref Range   Neutrophils Relative % 91 %   Neutro Abs 14.5 (H) 1.7 - 7.7 K/uL   Lymphocytes Relative 3 %   Lymphs Abs 0.5 (L) 0.7 - 4.0 K/uL   Monocytes Relative 4 %   Monocytes Absolute 0.6 0.1 - 1.0 K/uL   Eosinophils Relative 0 %   Eosinophils Absolute 0.0 0.0 - 0.5 K/uL   Basophils Relative 0 %   Basophils Absolute 0.0 0.0 - 0.1 K/uL   Immature Granulocytes 2 %   Abs Immature Granulocytes 0.30 (H) 0.00 - 0.07 K/uL    Comment: Performed at [facility] [address], [place], [place] [address]  Comprehensive metabolic panel     Status: Abnormal   Collection Time: 10/22/2018  7:57 PM  Result Value Ref Range   Sodium 130 (L) 135 - 145 mmol/L   Potassium 3.7 3.5 - 5.1 mmol/L   Chloride 90 (L) 98 -  111 mmol/L   CO2 20 (L) 22 - 32 mmol/L   Glucose, Bld 224 (H) 70 - 99 mg/dL   BUN 114 (H) 8 - 23 mg/dL   Creatinine, Ser 6.12 (H) 0.61 - 1.24 mg/dL   Calcium 7.3 (L) 8.9 - 10.3 mg/dL   Total Protein 7.5  6.5 - 8.1 g/dL   Albumin 2.8 (L) 3.5 - 5.0 g/dL   AST 1,474 (H) 15 - 41 U/L   ALT 800 (H) 0 - 44 U/L   Alkaline Phosphatase 911 (H) 38 - 126 U/L   Total Bilirubin 5.8 (H) 0.3 - 1.2 mg/dL   GFR calc non Af Amer 9 (L) >60 mL/min   GFR calc Af Amer 10 (L) >60 mL/min   Anion gap 20 (H) 5 - 15    Comment: Performed at [facility], [place] [address], [place], [place] [address]  I-stat troponin, ED     Status: None   Collection Time: 10/04/2018  8:14 PM  Result Value Ref Range   Troponin i, poc 0.02 0.00 - 0.08 ng/mL   Comment 3            Comment: Due to the release kinetics of cTnI, a negative result within the first hours of the onset of symptoms does not rule out myocardial infarction with certainty. If myocardial infarction is still suspected, repeat the test at appropriate intervals.   CK     Status: None   Collection Time: 10/08/2018 10:25 PM  Result Value Ref Range   Total CK 56 49 - 397 U/L    Comment: Performed at [facility], [place] [address] [place], [place] [address]  Brain natriuretic peptide     Status: None   Collection Time: 09/26/2018 10:25 PM  Result Value Ref Range   B Natriuretic Peptide 88.0 0.0 - 100.0 pg/mL    Comment: Performed at [facility] [address], [place], [place] [address]  Ammonia     Status: None   Collection Time: 10/16/2018 10:25 PM  Result Value Ref Range   Ammonia 23 9 - 35 umol/L    Comment: Performed at [facility], [place] [address], [place], [place] [address]  TSH     Status: Abnormal   Collection Time: 09/26/2018 10:25 PM  Result Value Ref Range   TSH 0.268 (L) 0.350 - 4.500 uIU/mL    Comment: Performed by a 3rd Generation assay with a functional sensitivity of <=0.01 uIU/mL. Performed at [facility], [place] [address], [place], [place] [address]   Magnesium     Status: Abnormal   Collection Time: 10/02/2018 10:25 PM  Result Value Ref Range   Magnesium 2.6 (H) 1.7 - 2.4 mg/dL    Comment: Performed at [facility] [address], [place], [place] [address]  Phosphorus     Status: Abnormal   Collection Time: 10/21/2018 10:25 PM  Result Value Ref Range   Phosphorus 7.1 (H) 2.5 - 4.6 mg/dL    Comment: Performed at [facility] [address] [place], [place] [address]  I-Stat CG4 Lactic Acid, ED     Status: None   Collection Time: 09/29/2018 10:34 PM  Result Value Ref Range   Lactic Acid, Venous 0.76 0.5 - 1.9 mmol/L  Urinalysis, Routine w reflex microscopic     Status: Abnormal   Collection Time: 10/16/2018 10:50 PM  Result Value Ref Range   Color, Urine AMBER (A) YELLOW  Comment: BIOCHEMICALS MAY BE AFFECTED BY COLOR   APPearance HAZY (A) CLEAR   Specific Gravity, Urine 1.011 1.005 - 1.030   pH 5.0 5.0 - 8.0   Glucose, UA NEGATIVE NEGATIVE mg/dL   Hgb urine dipstick SMALL (A) NEGATIVE   Bilirubin Urine NEGATIVE NEGATIVE   Ketones, ur NEGATIVE NEGATIVE mg/dL   Protein, ur 30 (A) NEGATIVE mg/dL   Nitrite NEGATIVE NEGATIVE   Leukocytes, UA NEGATIVE NEGATIVE   RBC / HPF 0-5 0 - 5 RBC/hpf   WBC, UA 6-10 0 - 5 WBC/hpf   Bacteria, UA RARE (A) NONE SEEN   Squamous Epithelial / LPF 0-5 0 - 5   Budding Yeast PRESENT     Comment: Performed at [facility] [address], [place], [place] [address]  Acetaminophen level     Status: Abnormal   Collection Time: 09/26/2018 11:22 PM  Result Value Ref Range   Acetaminophen (Tylenol), Serum <10 (L) 10 - 30 ug/mL    Comment: (NOTE) Therapeutic concentrations vary significantly. A range of 10-30 ug/mL  may be an effective concentration for many patients. However, some  are best treated at concentrations outside of this range. Acetaminophen concentrations >150 ug/mL at 4 hours after ingestion  and >50 ug/mL at 12 hours after ingestion are often associated with  toxic reactions. Performed at [facility], [place] [address] [place], [place] [address]   Salicylate level     Status: None   Collection Time: 10/20/2018 11:22 PM  Result  Value Ref Range   Salicylate Lvl <0.2 2.8 - 30.0 mg/dL    Comment: Performed at [facility] [address] [place], [place] [address]  I-Stat CG4 Lactic Acid, ED     Status: None   Collection Time: 10/11/2018 11:41 PM  Result Value Ref Range   Lactic Acid, Venous 0.63 0.5 - 1.9 mmol/L  Type and screen [place]     Status: None   Collection Time: 10/15/18 12:13 AM  Result Value Ref Range   ABO/RH(D) O POS    Antibody Screen NEG    Sample Expiration      10/18/2018 Performed at [facility], [place] [address] [place], [place] [address]   ABO/Rh     Status: None   Collection Time: 10/15/18 12:13 AM  Result Value Ref Range   ABO/RH(D)      O POS Performed at [facility] [address], [place], [place] [address]   Glucose, capillary     Status: Abnormal   Collection Time: 10/15/18  4:18 AM  Result Value Ref Range   Glucose-Capillary 170 (H) 70 - 99 mg/dL  Basic metabolic panel     Status: Abnormal   Collection Time: 10/15/18  7:23 AM  Result Value Ref Range   Sodium 131 (L) 135 - 145 mmol/L   Potassium 4.0 3.5 - 5.1 mmol/L   Chloride 95 (L) 98 - 111 mmol/L   CO2 15 (L) 22 - 32 mmol/L   Glucose, Bld 168 (H) 70 - 99 mg/dL   BUN 121 (H) 8 - 23 mg/dL   Creatinine, Ser 6.10 (H) 0.61 - 1.24 mg/dL   Calcium 6.9 (L) 8.9 - 10.3 mg/dL   GFR calc non Af Amer 9 (L) >60 mL/min   GFR calc Af Amer 10 (L) >60 mL/min   Anion gap 21 (H) 5 - 15    Comment: Performed at [facility]  [address] [place], [place] [address]  CBC WITH DIFFERENTIAL     Status: Abnormal   Collection Time: 10/15/18  7:23 AM  Result Value Ref Range   WBC 14.8 (H) 4.0 - 10.5 K/uL   RBC 3.30 (L) 4.22 - 5.81 MIL/uL   Hemoglobin 9.2 (L) 13.0 - 17.0 g/dL   HCT 28.1 (L) 39.0 - 52.0 %   MCV 85.2 80.0 - 100.0 fL   MCH 27.9 26.0 - 34.0 pg   MCHC 32.7 30.0 - 36.0 g/dL   RDW 14.4 11.5 - 15.5 %   Platelets 210 150 - 400 K/uL   nRBC 0.0 0.0 - 0.2 %   Neutrophils Relative %  92 %   Neutro Abs 13.5 (H) 1.7 - 7.7 K/uL   Lymphocytes Relative 3 %   Lymphs Abs 0.5 (L) 0.7 - 4.0 K/uL   Monocytes Relative 3 %   Monocytes Absolute 0.5 0.1 - 1.0 K/uL   Eosinophils Relative 0 %   Eosinophils Absolute 0.0 0.0 - 0.5 K/uL   Basophils Relative 0 %   Basophils Absolute 0.0 0.0 - 0.1 K/uL   Immature Granulocytes 2 %   Abs Immature Granulocytes 0.30 (H) 0.00 - 0.07 K/uL    Comment: Performed at [facility], [address], [place], [place] [address]  Hepatic function panel     Status: Abnormal   Collection Time: 10/15/18  7:23 AM  Result Value Ref Range   Total Protein 6.4 (L) 6.5 - 8.1 g/dL   Albumin 2.3 (L) 3.5 - 5.0 g/dL   AST 1,475 (H) 15 - 41 U/L   ALT 737 (H) 0 - 44 U/L   Alkaline Phosphatase 890 (H) 38 - 126 U/L   Total Bilirubin 5.6 (H) 0.3 - 1.2 mg/dL   Bilirubin, Direct 3.8 (H) 0.0 - 0.2 mg/dL   Indirect Bilirubin 1.8 (H) 0.3 - 0.9 mg/dL    Comment: Performed at [facility] [address] [place], [place] [address]  Troponin I - Now Then Q6H     Status: Abnormal   Collection Time: 10/15/18  7:23 AM  Result Value Ref Range   Troponin I 0.06 (HH) <0.03 ng/mL    Comment: CRITICAL RESULT CALLED TO, READ BACK BY AND VERIFIED WITH: Berenice Primas AT 3254 10/15/2018 BY ZBEECH. Performed at [facility], [place] [address], [place], [place] [address]   CK total and CKMB (cardiac)not at [facility]     Status: None   Collection Time: 10/15/18  7:23 AM  Result Value Ref Range   Total CK 58 49 - 397 U/L   CK, MB 1.9 0.5 - 5.0 ng/mL   Relative Index RELATIVE INDEX IS INVALID 0.0 - 2.5    Comment: WHEN CK < 100 U/L        Performed at [facility] [address], [place],  [address]    Ct Head Wo Contrast  Result Date: 09/28/2018 CLINICAL DATA:  Multiple falls over the past 3 weeks. EXAM: CT HEAD WITHOUT CONTRAST TECHNIQUE: Contiguous axial images were obtained from the base of the skull through the vertex without intravenous contrast.  COMPARISON:  Head CT and brain MRI 08/26/2018. FINDINGS: Brain: No evidence of acute infarction, hemorrhage, hydrocephalus, extra-axial collection or mass lesion/mass effect. There is some cortical atrophy. Vascular: No hyperdense vessel or unexpected calcification. Skull: Intact.  No focal lesion. Sinuses/Orbits: Negative. Other: None. IMPRESSION: No acute abnormality. Mild cortical atrophy. Electronically Signed   By: Inge Rise  M.D.   On: 09/30/2018 20:29   Nm Hepatobiliary Liver Func  Result Date: 10/15/2018 CLINICAL DATA:  Abdominal pain.  Abnormal CT scan. EXAM: NUCLEAR MEDICINE HEPATOBILIARY IMAGING TECHNIQUE: Sequential images of the abdomen were obtained out to 60 minutes following intravenous administration of radiopharmaceutical. RADIOPHARMACEUTICALS:  5.4 mCi Tc-33m Choletec IV COMPARISON:  CT scan 001/25/202019 FINDINGS: Fairly prompt but somewhat heterogeneous uptake in the liver. The biliary tree is not visualized for certain and the gallbladder is never visualized at 2 hours. I do not see any obvious activity in the bowel. IMPRESSION: Findings suggest biliary obstruction with non visualization of the biliary tree and gallbladder at 2 hours. Electronically Signed   By: PMarijo SanesM.D.   On: 10/15/2018 10:23   Dg Pelvis Portable  Result Date: 10/15/2018 CLINICAL DATA:  Recent fall with pelvic pain, initial encounter EXAM: PORTABLE PELVIS 1-2 VIEWS COMPARISON:  CT from the previous day. FINDINGS: A portion of the pelvis is obscured by overlying bowel content. No acute fracture or dislocation is noted. Degenerative change of the hip joints and lumbar spine are seen. Scattered distended colon with fecal material within is again identified and stable. IMPRESSION: No acute fracture noted. Electronically Signed   By: MInez CatalinaM.D.   On: 10/15/2018 10:18   Dg Chest Port 1 View  Result Date: 10/05/2018 CLINICAL DATA:  Cough, short of breath EXAM: PORTABLE CHEST 1 VIEW COMPARISON:   10/10/2018, 08/26/2018 FINDINGS: Elevation of left diaphragm. Linear scarring or atelectasis at the right base. No consolidation or effusion. Stable cardiomediastinal silhouette. No pneumothorax. IMPRESSION: No active disease. Stable elevation of left diaphragm with basilar atelectasis or scarring Electronically Signed   By: KDonavan FoilM.D.   On: 10/20/2018 21:59   Ct Renal Stone Study  Result Date: 10/15/2018 CLINICAL DATA:  63year old male with abdominal distention. EXAM: CT ABDOMEN AND PELVIS WITHOUT CONTRAST TECHNIQUE: Multidetector CT imaging of the abdomen and pelvis was performed following the standard protocol without IV contrast. COMPARISON:  Abdominal radiograph dated 10/10/2018 and CT dated 11/28/2017 FINDINGS: Evaluation of this exam is limited in the absence of intravenous contrast. Lower chest: There is eventration of the left hemidiaphragm. There are bibasilar atelectasis/scarring. Evaluation of the lungs is limited due to respiratory motion artifact. There is shift of the mediastinum to the right of the midline secondary to mass effect caused by elevated left hemidiaphragm. This is similar to prior CT. No intra-abdominal free air. Small perihepatic free fluid. Hepatobiliary: The liver is unremarkable as visualized. No intrahepatic biliary ductal dilatation. The gallbladder is distended and contains slightly high attenuating content, likely hyper concentrated bile or sludge. There is diffuse haziness of the wall of the gallbladder and pericholecystic fat concerning for acute cholecystitis. Further evaluation with ultrasound recommended. Pancreas: Unremarkable. No pancreatic ductal dilatation or surrounding inflammatory changes. Spleen: Normal in size without focal abnormality. Adrenals/Urinary Tract: The adrenal glands are unremarkable. There is no hydronephrosis or nephrolithiasis on either side. There has been interval removal of the previously seen percutaneous nephrostomy tubes and  staghorn calculi. A 2 cm exophytic hypodense lesion from the posterior interpolar left kidney is not characterized but appears similar to prior CT. The visualized ureters appear unremarkable. The urinary bladder is decompressed around a [place] catheter. Stomach/Bowel: There is large amount of dense stool throughout the colon and in the rectum. The sigmoid colon is redundant. There is air distention of the transverse colon measuring up to 8.5 cm in diameter. No evidence of mechanical obstruction. The small bowel  is unremarkable. No evidence of acute appendicitis. Vascular/Lymphatic: The abdominal aorta and IVC are grossly unremarkable on this noncontrast CT. No portal venous gas. There is no adenopathy. Reproductive: The prostate and seminal vesicles are are grossly unremarkable. No pelvic mass. Other: None Musculoskeletal: Osteopenia with degenerative changes of the spine. Multilevel compression fracture primarily at L3 and L5 similar to prior CT. There is vertebroplasty changes at L3. No acute fracture. IMPRESSION: 1. Distended gallbladder with findings concerning for acute cholecystitis. Further evaluation with ultrasound recommended. 2. Severe constipation with possible fecal impaction within the rectum. No bowel obstruction. 3. Interval removal of the previously seen percutaneous nephrostomy tubes and staghorn calculi. No hydronephrosis or nephrolithiasis. Electronically Signed   By: Anner Crete M.D.   On: 10/15/2018 00:06   Anti-infectives (From admission, onward)   Start     Dose/Rate Route Frequency Ordered Stop   10/15/18 0245  piperacillin-tazobactam (ZOSYN) IVPB 2.25 g     2.25 g 100 mL/hr over 30 Minutes Intravenous Every 8 hours 10/15/18 0238     10/09/2018 2315  cefTRIAXone (ROCEPHIN) 1 g in sodium chloride 0.9 % 100 mL IVPB     1 g 200 mL/hr over 30 Minutes Intravenous  Once 10/17/2018 2312 10/15/18 0109         Assessment/Plan COPD Diastolic CHF - repeat ECHO ordered CKD-IV - Cr 6.9  today Chronic anemia DM2 Anxiety and depression  Multiple recent falls Constipation  Elevated LFTs ?Cholecystitis  - Patient with markedly elevated LFTs and possible acute cholecystitis on initial imaging. He is completely nontender on abdominal exam. HIDA suggests biliary obstruction, but no definite acute cholecystitis.  He went for MRCP earlier this morning but was unable to tolerate exam.  Will order an abdominal u/s to evaluate for gallstones. Would recommend trending LFTs and call for GI consult. Hepatitis panel is pending.  Will continue to follow.  ID - rocephin 1/20>>1/21, zosyn 1/21>> VTE - SCDs FEN - IVF, NPO [place] - in place   [place], [facility] Surgery 10/15/2018, 11:54 AM Pager: [phone] Mon-Thurs 7:00 am-4:30 pm Fri 7:00 am -11:30 AM Sat-Sun 7:00 am-11:30 am

## 2018-10-15 NOTE — Plan of Care (Signed)
  Problem: Safety: Goal: Ability to remain free from injury will improve Outcome: Progressing Note:  Bed alarm on; call light, phone, and personal items within reach.  Pt agrees to call staff for assistance getting out of bed.

## 2018-10-15 NOTE — Progress Notes (Signed)
PROGRESS NOTE  Javier Ho. KDT:[id number] DOB: Mar 02, 1956 DOA: 10/24/2018 PCP: Deloria Lair., MD   LOS: 1 day   Brief Narrative / Interim history: 63 y.o. male with history of COPD, diastolic CHF, chronic kidney disease stage IV, chronic anemia, diabetes mellitus type 2, anxiety and depression admitted with weakness and multiple falls the last 1 week. On admission, found to have markedly elevated liver enzymes, worsening renal function with uremia and nonspecific EKG changes with multiple PVCs.  CT head without acute intracranial finding.  Portable chest x-ray without acute cardiopulmonary finding.  CT renal stone revealed distended gallbladder and severe constipation possible fecal impaction.  HIDA positive.  Patient could not tolerate MRCP. General surgery, GI and nephrology consulted.  Subjective: Sleepy with non coherent speech.  Reports right-sided abdominal pain.  Barely follows command.  Denies chest pain or shortness of breath.  Assessment & Plan: Active Problems:   COPD (chronic obstructive pulmonary disease) (HCC)   Chronic use of opiate for therapeutic purpose   Diabetes mellitus with nephropathy ([place])   ARF (acute renal failure) (HCC)   Urinary retention   Lower urinary tract infectious disease   Hepatitis   Frequent falls   Bronchitis   Acute renal failure superimposed on chronic kidney disease (HCC)  Markedly elevated liver enzymes/hyperbilirubinemia/alkaline phosphatase: concern for cholecystitis/biliary obstruction.  HIDA scan positive.  Patient could not tolerate MRCP even with Ativan this morning.  Tylenol level negative.  Creatinine kinase within normal range. -Appreciate general surgery input -GI consulted to start -RUQ ultrasound -Continue trending liver enzymes -Acute hepatitis panel. -Continue Zosyn for possible infectious etiology. -Follow cultures  AKI on CKD/uremia/hyperphosphatemia: CT renal stone study did not reveal any obstructive etiology.   Only on low-dose aspirin and Lasix at home. -Obtain urine study -Nephrology consulted -We will continue trending. -Continue IV normal saline  Anion gap metabolic acidosis: Likely due to uremia and renal failure as above -Manage as above  History of diastolic CHF: EF 55 to 99% with G1 DD.  No signs of fluid overload on exam.  No respiratory distress. -We will closely monitor daily weight, intake output and renal function while on IV fluid. -Update echocardiogram  Constipation: Possible fecal impaction on CT urogram. -Enema after RUQ ultrasound.  Multiple fall: Could be due to the above.  CT head without acute intracranial finding. -PT/OT eval when he recovers from his illness.  DM2: Last A1c 7.9% in 2018.  On 50 units of Lantus at home. -CBG monitoring -SSI-renal -Check A1c  COPD: Stable. -Continue breathing treatments.  Multiple PVCs: Likely due to electrolyte issues with renal failure. -Correct lites  Scheduled Meds: . budesonide (PULMICORT) nebulizer solution  0.25 mg Nebulization BID  . insulin aspart  0-9 Units Subcutaneous Q4H  . ipratropium-albuterol  3 mL Nebulization TID  . sorbitol, milk of mag, mineral oil, glycerin (SMOG) enema  960 mL Rectal Once   Continuous Infusions: . sodium chloride    . piperacillin-tazobactam (ZOSYN)  IV 2.25 g (10/15/18 0445)   PRN Meds:.hydrALAZINE, ipratropium-albuterol, ondansetron **OR** ondansetron (ZOFRAN) IV  DVT prophylaxis: SCD Code Status: Full code Family Communication: Family member at bedside. Disposition Plan: We will escalate his care to stepdown unit.  Patient with full organ failure.   Consultants:   General surgery  Gastroenterology  Nephrology  Procedures:   None  Antimicrobials:  Zosyn 1/20-->  Objective: Vitals:   10/15/18 0158 10/15/18 0343 10/15/18 0419 10/15/18 1241  BP: 114/72  116/71 123/81  Pulse: 96  93  Resp: 16     Temp: 98.2 F (36.8 C)  97.9 F (36.6 C) 97.9 F (36.6 C)    TempSrc: Oral  Oral Oral  SpO2: 100% 97% 96%   Weight: 105.9 kg     Height: 6' 1"  (1.854 m)       Intake/Output Summary (Last 24 hours) at 10/15/2018 1320 Last data filed at 10/15/2018 1239 Gross per 24 hour  Intake 1365.33 ml  Output 1275 ml  Net 90.33 ml   Filed Weights   10/02/2018 1956 10/15/18 0158  Weight: 110.2 kg 105.9 kg    Examination:  GENERAL: sleepy and minimally follows command HEENT: MMM. Hearing grossly intact.  NECK: Supple.  No JVD.  LUNGS:  No IWOB. Good air movement. CTAB.  HEART:  RRR. Heart sounds normal.  ABD: Bowel sounds present. Mildly distended. Tenderness over right abdomen MSK/EXT:  Some jerking in both upper extremities SKIN: no apparent skin lesion.  NEURO: sleepy and minimally follows command. Asks me how many of this surgeries I have done in the past. PERL. Patellar reflex symmetric. Not able to perform further neuro exam due to patient's mental status   Data Reviewed: I have independently reviewed following labs and imaging studies  CBC: Recent Labs  Lab 10/17/2018 1957 10/15/18 0723  WBC 15.9* 14.8*  NEUTROABS 14.5* 13.5*  HGB 9.7* 9.2*  HCT 30.4* 28.1*  MCV 86.9 85.2  PLT 233 765   Basic Metabolic Panel: Recent Labs  Lab 10/13/2018 1957 09/29/2018 2225 10/15/18 0723  NA 130*  --  131*  K 3.7  --  4.0  CL 90*  --  95*  CO2 20*  --  15*  GLUCOSE 224*  --  168*  BUN 114*  --  121*  CREATININE 6.12*  --  6.10*  CALCIUM 7.3*  --  6.9*  MG  --  2.6*  --   PHOS  --  7.1*  --    GFR: Estimated Creatinine Clearance: 15.6 mL/min (A) (by C-G formula based on SCr of 6.1 mg/dL (H)). Liver Function Tests: Recent Labs  Lab 10/02/2018 1957 10/15/18 0723  AST 1,474* 1,475*  ALT 800* 737*  ALKPHOS 911* 890*  BILITOT 5.8* 5.6*  PROT 7.5 6.4*  ALBUMIN 2.8* 2.3*   No results for input(s): LIPASE, AMYLASE in the last 168 hours. Recent Labs  Lab 10/24/2018 2225  AMMONIA 23   Coagulation Profile: Recent Labs  Lab 10/25/2018 1957  INR  1.29   Cardiac Enzymes: Recent Labs  Lab 10/07/2018 2225 10/15/18 0723  CKTOTAL 56 58  CKMB  --  1.9  TROPONINI  --  0.06*   BNP (last 3 results) No results for input(s): PROBNP in the last 8760 hours. HbA1C: No results for input(s): HGBA1C in the last 72 hours. CBG: Recent Labs  Lab 10/15/18 0418 10/15/18 1210  GLUCAP 170* 134*   Lipid Profile: No results for input(s): CHOL, HDL, LDLCALC, TRIG, CHOLHDL, LDLDIRECT in the last 72 hours. Thyroid Function Tests: Recent Labs    09/30/2018 2225  TSH 0.268*   Anemia Panel: No results for input(s): VITAMINB12, FOLATE, FERRITIN, TIBC, IRON, RETICCTPCT in the last 72 hours. Urine analysis:    Component Value Date/Time   COLORURINE AMBER (A) 10/17/2018 2250   APPEARANCEUR HAZY (A) 09/28/2018 2250   LABSPEC 1.011 10/16/2018 2250   PHURINE 5.0 10/09/2018 2250   GLUCOSEU NEGATIVE 10/20/2018 2250   HGBUR SMALL (A) 09/26/2018 2250   BILIRUBINUR NEGATIVE 10/20/2018 2250   KETONESUR NEGATIVE 10/09/2018  [place] (A) 09/28/2018 2250   NITRITE NEGATIVE 10/08/2018 2250   LEUKOCYTESUR NEGATIVE 10/10/2018 2250   Sepsis Labs: Invalid input(s): PROCALCITONIN, LACTICIDVEN  No results found for this or any previous visit (from the past 240 hour(s)).    Radiology Studies: Ct Head Wo Contrast  Result Date: 10/02/2018 CLINICAL DATA:  Multiple falls over the past 3 weeks. EXAM: CT HEAD WITHOUT CONTRAST TECHNIQUE: Contiguous axial images were obtained from the base of the skull through the vertex without intravenous contrast. COMPARISON:  Head CT and brain MRI 08/26/2018. FINDINGS: Brain: No evidence of acute infarction, hemorrhage, hydrocephalus, extra-axial collection or mass lesion/mass effect. There is some cortical atrophy. Vascular: No hyperdense vessel or unexpected calcification. Skull: Intact.  No focal lesion. Sinuses/Orbits: Negative. Other: None. IMPRESSION: No acute abnormality. Mild cortical atrophy. Electronically Signed    By: Inge Rise M.D.   On: 09/27/2018 20:29   Nm Hepatobiliary Liver Func  Result Date: 10/15/2018 CLINICAL DATA:  Abdominal pain.  Abnormal CT scan. EXAM: NUCLEAR MEDICINE HEPATOBILIARY IMAGING TECHNIQUE: Sequential images of the abdomen were obtained out to 60 minutes following intravenous administration of radiopharmaceutical. RADIOPHARMACEUTICALS:  5.4 mCi Tc-86m Choletec IV COMPARISON:  CT scan 001/29/202019 FINDINGS: Fairly prompt but somewhat heterogeneous uptake in the liver. The biliary tree is not visualized for certain and the gallbladder is never visualized at 2 hours. I do not see any obvious activity in the bowel. IMPRESSION: Findings suggest biliary obstruction with non visualization of the biliary tree and gallbladder at 2 hours. Electronically Signed   By: PMarijo SanesM.D.   On: 10/15/2018 10:23   Dg Pelvis Portable  Result Date: 10/15/2018 CLINICAL DATA:  Recent fall with pelvic pain, initial encounter EXAM: PORTABLE PELVIS 1-2 VIEWS COMPARISON:  CT from the previous day. FINDINGS: A portion of the pelvis is obscured by overlying bowel content. No acute fracture or dislocation is noted. Degenerative change of the hip joints and lumbar spine are seen. Scattered distended colon with fecal material within is again identified and stable. IMPRESSION: No acute fracture noted. Electronically Signed   By: MInez CatalinaM.D.   On: 10/15/2018 10:18   Dg Chest Port 1 View  Result Date: 10/25/2018 CLINICAL DATA:  Cough, short of breath EXAM: PORTABLE CHEST 1 VIEW COMPARISON:  10/10/2018, 08/26/2018 FINDINGS: Elevation of left diaphragm. Linear scarring or atelectasis at the right base. No consolidation or effusion. Stable cardiomediastinal silhouette. No pneumothorax. IMPRESSION: No active disease. Stable elevation of left diaphragm with basilar atelectasis or scarring Electronically Signed   By: KDonavan FoilM.D.   On: 10/01/2018 21:59   Ct Renal Stone Study  Result Date:  10/15/2018 CLINICAL DATA:  63year old male with abdominal distention. EXAM: CT ABDOMEN AND PELVIS WITHOUT CONTRAST TECHNIQUE: Multidetector CT imaging of the abdomen and pelvis was performed following the standard protocol without IV contrast. COMPARISON:  Abdominal radiograph dated 10/10/2018 and CT dated 11/28/2017 FINDINGS: Evaluation of this exam is limited in the absence of intravenous contrast. Lower chest: There is eventration of the left hemidiaphragm. There are bibasilar atelectasis/scarring. Evaluation of the lungs is limited due to respiratory motion artifact. There is shift of the mediastinum to the right of the midline secondary to mass effect caused by elevated left hemidiaphragm. This is similar to prior CT. No intra-abdominal free air. Small perihepatic free fluid. Hepatobiliary: The liver is unremarkable as visualized. No intrahepatic biliary ductal dilatation. The gallbladder is distended and contains slightly high attenuating content, likely hyper concentrated bile or  sludge. There is diffuse haziness of the wall of the gallbladder and pericholecystic fat concerning for acute cholecystitis. Further evaluation with ultrasound recommended. Pancreas: Unremarkable. No pancreatic ductal dilatation or surrounding inflammatory changes. Spleen: Normal in size without focal abnormality. Adrenals/Urinary Tract: The adrenal glands are unremarkable. There is no hydronephrosis or nephrolithiasis on either side. There has been interval removal of the previously seen percutaneous nephrostomy tubes and staghorn calculi. A 2 cm exophytic hypodense lesion from the posterior interpolar left kidney is not characterized but appears similar to prior CT. The visualized ureters appear unremarkable. The urinary bladder is decompressed around a Foley catheter. Stomach/Bowel: There is large amount of dense stool throughout the colon and in the rectum. The sigmoid colon is redundant. There is air distention of the  transverse colon measuring up to 8.5 cm in diameter. No evidence of mechanical obstruction. The small bowel is unremarkable. No evidence of acute appendicitis. Vascular/Lymphatic: The abdominal aorta and IVC are grossly unremarkable on this noncontrast CT. No portal venous gas. There is no adenopathy. Reproductive: The prostate and seminal vesicles are are grossly unremarkable. No pelvic mass. Other: None Musculoskeletal: Osteopenia with degenerative changes of the spine. Multilevel compression fracture primarily at L3 and L5 similar to prior CT. There is vertebroplasty changes at L3. No acute fracture. IMPRESSION: 1. Distended gallbladder with findings concerning for acute cholecystitis. Further evaluation with ultrasound recommended. 2. Severe constipation with possible fecal impaction within the rectum. No bowel obstruction. 3. Interval removal of the previously seen percutaneous nephrostomy tubes and staghorn calculi. No hydronephrosis or nephrolithiasis. Electronically Signed   By: Anner Crete M.D.   On: 10/15/2018 00:06   [facility] Pager [phone]  If 7PM-7AM, please contact night-coverage [email or web address] Password [email or web address] 10/15/2018, 1:20 PM

## 2018-10-15 NOTE — Consult Note (Signed)
Reason for Consult: Acute kidney injury on chronic kidney disease stage IV Referring Physician: Wendee Beavers MD [facility])  HPI: (History obtained from chart, patient encephalopathic) 63 year old Caucasian man with past medical history significant for diastolic heart failure, type 2 diabetes mellitus, prior history of staghorn calculus with nephrostomy (per epic records), chronic anemia, anxiety/depression and chronic kidney disease stage IV under the care of Dr. Lowanda Foster in [place].  Admitted to the hospital late this morning with complaints of feeling weak and having had multiple falls without any nausea, vomiting or diarrhea.  He also reported to the admitting physician some cough and wheezing.  Initial labs are significant for creatinine of 6.1 up from 3.2 9 months ago, BUN 121, bicarbonate level 15, sodium 131, AST 1475, ALT 737, total bilirubin 5.6, direct 3.8/indirect 1.8 with albumin 2.3.  CT abdomen stone protocol suggestive of cholecystitis with additional evaluation done by HIDA scan that shows findings suggestive of biliary obstruction.  Past Medical History:  Diagnosis Date  . Anemia   . Chest pain   . CHF (congestive heart failure) ([place])   . Chronic bronchitis ([place])   . Chronic headaches   . CKD (chronic kidney disease)   . Depressive disorder, not elsewhere classified   . GERD (gastroesophageal reflux disease)   . HBP (high blood pressure)   . Laceration of face, multiple sites   . Lesion of lung   . Morbid obesity ([place])   . Multiple rib fractures   . Other diseases of lung, not elsewhere classified   . PNA (pneumonia)   . Sepsis ([place])   . Shortness of breath   . Steatohepatitis   . Type II or unspecified type diabetes mellitus without mention of complication, uncontrolled   . Unspecified essential hypertension     Past Surgical History:  Procedure Laterality Date  . circumcision for Chronic Balanitis  2009  . COLONOSCOPY WITH ESOPHAGOGASTRODUODENOSCOPY (EGD)  04/08/2014  .  cystoscopic left Ureteral stone removal  1996  . LUMBAR LAMINECTOMY  1976  . NEPHROSTOMY    . small intestine endoscopy  04/08/2014    Family History  Problem Relation Age of Onset  . Colon cancer Neg Hx     Social History:  reports that he has never smoked. He has never used smokeless tobacco. He reports that he does not drink alcohol or use drugs.  Allergies:  Allergies  Allergen Reactions  . Sulfa Antibiotics Nausea And Vomiting    unknown  . Meperidine Other (See Comments)    "makes me crazy"    Medications:  Scheduled: . budesonide (PULMICORT) nebulizer solution  0.25 mg Nebulization BID  . insulin aspart  0-9 Units Subcutaneous Q4H  . ipratropium-albuterol  3 mL Nebulization TID  . sorbitol, milk of mag, mineral oil, glycerin (SMOG) enema  960 mL Rectal Once    BMP Latest Ref Rng & Units 10/15/2018 10/11/2018 11/28/2017  Glucose 70 - 99 mg/dL 168(H) 224(H) 146(H)  BUN 8 - 23 mg/dL 121(H) 114(H) 30(H)  Creatinine 0.61 - 1.24 mg/dL 6.10(H) 6.12(H) 3.23(H)  Sodium 135 - 145 mmol/L 131(L) 130(L) 137  Potassium 3.5 - 5.1 mmol/L 4.0 3.7 4.1  Chloride 98 - 111 mmol/L 95(L) 90(L) 97(L)  CO2 22 - 32 mmol/L 15(L) 20(L) 28  Calcium 8.9 - 10.3 mg/dL 6.9(L) 7.3(L) 8.7(L)   CBC Latest Ref Rng & Units 10/15/2018 10/21/2018 11/28/2017  WBC 4.0 - 10.5 K/uL 14.8(H) 15.9(H) 5.0  Hemoglobin 13.0 - 17.0 g/dL 9.2(L) 9.7(L) 8.0(L)  Hematocrit 39.0 -  52.0 % 28.1(L) 30.4(L) 25.9(L)  Platelets 150 - 400 K/uL 210 233 178     Ct Head Wo Contrast  Result Date: 09/25/2018 CLINICAL DATA:  Multiple falls over the past 3 weeks. EXAM: CT HEAD WITHOUT CONTRAST TECHNIQUE: Contiguous axial images were obtained from the base of the skull through the vertex without intravenous contrast. COMPARISON:  Head CT and brain MRI 08/26/2018. FINDINGS: Brain: No evidence of acute infarction, hemorrhage, hydrocephalus, extra-axial collection or mass lesion/mass effect. There is some cortical atrophy. Vascular: No  hyperdense vessel or unexpected calcification. Skull: Intact.  No focal lesion. Sinuses/Orbits: Negative. Other: None. IMPRESSION: No acute abnormality. Mild cortical atrophy. Electronically Signed   By: Inge Rise M.D.   On: 09/28/2018 20:29   Nm Hepatobiliary Liver Func  Result Date: 10/15/2018 CLINICAL DATA:  Abdominal pain.  Abnormal CT scan. EXAM: NUCLEAR MEDICINE HEPATOBILIARY IMAGING TECHNIQUE: Sequential images of the abdomen were obtained out to 60 minutes following intravenous administration of radiopharmaceutical. RADIOPHARMACEUTICALS:  5.4 mCi Tc-81m Choletec IV COMPARISON:  CT scan 001/30/202019 FINDINGS: Fairly prompt but somewhat heterogeneous uptake in the liver. The biliary tree is not visualized for certain and the gallbladder is never visualized at 2 hours. I do not see any obvious activity in the bowel. IMPRESSION: Findings suggest biliary obstruction with non visualization of the biliary tree and gallbladder at 2 hours. Electronically Signed   By: PMarijo SanesM.D.   On: 10/15/2018 10:23   Dg Pelvis Portable  Result Date: 10/15/2018 CLINICAL DATA:  Recent fall with pelvic pain, initial encounter EXAM: PORTABLE PELVIS 1-2 VIEWS COMPARISON:  CT from the previous day. FINDINGS: A portion of the pelvis is obscured by overlying bowel content. No acute fracture or dislocation is noted. Degenerative change of the hip joints and lumbar spine are seen. Scattered distended colon with fecal material within is again identified and stable. IMPRESSION: No acute fracture noted. Electronically Signed   By: MInez CatalinaM.D.   On: 10/15/2018 10:18   Dg Chest Port 1 View  Result Date: 09/30/2018 CLINICAL DATA:  Cough, short of breath EXAM: PORTABLE CHEST 1 VIEW COMPARISON:  10/10/2018, 08/26/2018 FINDINGS: Elevation of left diaphragm. Linear scarring or atelectasis at the right base. No consolidation or effusion. Stable cardiomediastinal silhouette. No pneumothorax. IMPRESSION: No active disease.  Stable elevation of left diaphragm with basilar atelectasis or scarring Electronically Signed   By: KDonavan FoilM.D.   On: 10/13/2018 21:59   Ct Renal Stone Study  Result Date: 10/15/2018 CLINICAL DATA:  63year old male with abdominal distention. EXAM: CT ABDOMEN AND PELVIS WITHOUT CONTRAST TECHNIQUE: Multidetector CT imaging of the abdomen and pelvis was performed following the standard protocol without IV contrast. COMPARISON:  Abdominal radiograph dated 10/10/2018 and CT dated 11/28/2017 FINDINGS: Evaluation of this exam is limited in the absence of intravenous contrast. Lower chest: There is eventration of the left hemidiaphragm. There are bibasilar atelectasis/scarring. Evaluation of the lungs is limited due to respiratory motion artifact. There is shift of the mediastinum to the right of the midline secondary to mass effect caused by elevated left hemidiaphragm. This is similar to prior CT. No intra-abdominal free air. Small perihepatic free fluid. Hepatobiliary: The liver is unremarkable as visualized. No intrahepatic biliary ductal dilatation. The gallbladder is distended and contains slightly high attenuating content, likely hyper concentrated bile or sludge. There is diffuse haziness of the wall of the gallbladder and pericholecystic fat concerning for acute cholecystitis. Further evaluation with ultrasound recommended. Pancreas: Unremarkable. No pancreatic ductal dilatation or  surrounding inflammatory changes. Spleen: Normal in size without focal abnormality. Adrenals/Urinary Tract: The adrenal glands are unremarkable. There is no hydronephrosis or nephrolithiasis on either side. There has been interval removal of the previously seen percutaneous nephrostomy tubes and staghorn calculi. A 2 cm exophytic hypodense lesion from the posterior interpolar left kidney is not characterized but appears similar to prior CT. The visualized ureters appear unremarkable. The urinary bladder is decompressed around  a Foley catheter. Stomach/Bowel: There is large amount of dense stool throughout the colon and in the rectum. The sigmoid colon is redundant. There is air distention of the transverse colon measuring up to 8.5 cm in diameter. No evidence of mechanical obstruction. The small bowel is unremarkable. No evidence of acute appendicitis. Vascular/Lymphatic: The abdominal aorta and IVC are grossly unremarkable on this noncontrast CT. No portal venous gas. There is no adenopathy. Reproductive: The prostate and seminal vesicles are are grossly unremarkable. No pelvic mass. Other: None Musculoskeletal: Osteopenia with degenerative changes of the spine. Multilevel compression fracture primarily at L3 and L5 similar to prior CT. There is vertebroplasty changes at L3. No acute fracture. IMPRESSION: 1. Distended gallbladder with findings concerning for acute cholecystitis. Further evaluation with ultrasound recommended. 2. Severe constipation with possible fecal impaction within the rectum. No bowel obstruction. 3. Interval removal of the previously seen percutaneous nephrostomy tubes and staghorn calculi. No hydronephrosis or nephrolithiasis. Electronically Signed   By: Anner Crete M.D.   On: 10/15/2018 00:06    Review of Systems  Unable to perform ROS: Mental status change (Patient encephalopathic)   Blood pressure 123/81, pulse 93, temperature 97.9 F (36.6 C), temperature source Oral, resp. rate 16, height 6' 1"  (1.854 m), weight 105.9 kg, SpO2 96 %. Physical Exam  Nursing note and vitals reviewed. Constitutional: He appears well-developed and well-nourished.  Encephalopathic, confused  HENT:  Head: Normocephalic.  Nose: Nose normal.  Oropharynx dry with a left supraorbital bruise  Eyes: Conjunctivae are normal. No scleral icterus.  Neck: Normal range of motion. Neck supple. No JVD present.  Cardiovascular: Normal rate, regular rhythm and normal heart sounds.  No murmur heard. Respiratory: He has no  wheezes. He has no rales.  GI: Soft. Bowel sounds are normal. He exhibits distension. There is abdominal tenderness.  Right upper quadrant tenderness, moderate global distention  Musculoskeletal:        General: No edema.  Neurological:  Confused/lethargic  Skin: Skin is warm and dry. No erythema.    Assessment/Plan: 1.  Acute kidney injury on chronic kidney disease stage IV: At this point, unclear if this is frank progression on to end-stage renal disease or an acute insult associated with his cholecystitis/SIRs.  I will switch his fluids to isotonic sodium bicarbonate and attempt volume replacement overnight.  If he continues to have worsening renal function and mentation not better with management of cholecystitis, will undertake hemodialysis for management of what possibly might be uremic encephalopathy.  Will monitor input/output as well as labs closely.  Continue to limit hypotension, iodinated intravenous contrast exposure and avoid nonsteroidal anti-inflammatory drugs. 2.  Anion gap metabolic acidosis: Secondary to acute kidney injury/starvation ketosis-begin isotonic sodium bicarbonate. 3.  Encephalopathy: Likely metabolic encephalopathy with multiple incriminating etiologies including acute liver injury/SIRS and possibly uremia.  Attempt management of underlying etiologies along with volume expansion. 4.  Acute cholecystitis: With positive HIDA scan in the background of suggestive lab and clinical findings including markedly elevated transaminases, hyperbilirubinemia and abdominal tenderness.  Awaiting surgical evaluation for possible cholecystectomy while on  intravenous Zosyn. 5.  History of diastolic heart failure: Without evidence of volume overload and in fact appears to be volume depleted on exam-we will continue volume expansion with isotonic bicarbonate. 6.  Anemia: Likely anemia of chronic kidney disease versus critical illness based on current presentation.  Will check iron  studies.  Anette Barra K. 10/15/2018, 2:33 PM

## 2018-10-15 NOTE — Progress Notes (Signed)
Pharmacy Antibiotic Note  Javier Ho. is a 63 y.o. male admitted on 10/21/2018 with possible acute cholecystitis.  Pharmacy has been consulted for Zosyn dosing. WBC elevated. Acute on chronic renal failure.   Plan: Zosyn 2.25g IV q8h Trend WBC, temp, renal function  F/U infectious work-up  Height: 6' 1"  (185.4 cm) Weight: 233 lb 7.5 oz (105.9 kg) IBW/kg (Calculated) : 79.9  Temp (24hrs), Avg:98.1 F (36.7 C), Min:97.9 F (36.6 C), Max:98.2 F (36.8 C)  Recent Labs  Lab 10/24/2018 1957 10/12/2018 2234 10/25/2018 2341  WBC 15.9*  --   --   CREATININE 6.12*  --   --   LATICACIDVEN  --  0.76 0.63    Estimated Creatinine Clearance: 15.6 mL/min (A) (by C-G formula based on SCr of 6.12 mg/dL (H)).    Allergies  Allergen Reactions  . Sulfa Antibiotics Nausea And Vomiting    unknown  . Meperidine Other (See Comments)    "makes me crazy"   Narda Bonds 10/15/2018 2:38 AM

## 2018-10-15 NOTE — H&P (Addendum)
History and Physical    Tenny Craw. ZJQ:[id number] DOB: 30-Mar-1956 DOA: 10/16/2018  PCP: Deloria Lair., MD  Patient coming from: Home.  Chief Complaint: Fall and shortness of breath.  HPI: Javier Hing. is a 63 y.o. male with history of COPD, diastolic CHF, chronic kidney disease stage IV, chronic anemia, diabetes mellitus type 2, anxiety and depression was brought to the ER after patient was feeling weak and has had multiple falls at least 5 times in the last 1 week as per the patient.  Patient said he feels weak to walk.  Denies any nausea vomiting diarrhea.  Has been having some wheezing and coughing for the last few days.  Patient is a poor historian.  Patient states his friends brought him to the ER.  Patient states he has been having recurrent falls and has hit his head but did not lose consciousness.  ED Course: In the ER patient labs revealed worsening of his kidney function from baseline of around 3 in care everywhere and it is around 6 on admission with AST and ALT markedly elevated at 1408 100 total bilirubin of 5.8 anion gap of 20 blood sugar of 224.  Troponin point-of-care was negative EKG shows nonspecific changes in the inferior leads with multiple PVCs.  UA shows yeast 6-10 WBC rare bacteria leukocyte Estrace negative nitrites negative.  Given the acute renal failure on chronic kidney disease CT renal study was done which does not show any obstruction but does show features concerning for acute cholecystitis.  On my exam patient abdomen is nontender.  Acute hepatitis panel has been ordered.  CT head was unremarkable.  Chest x-ray was unremarkable.  Hemoglobin is around 9 WBC count of 15,000.  Review of Systems: As per HPI, rest all negative.   Past Medical History:  Diagnosis Date  . Anemia   . Chest pain   . CHF (congestive heart failure) ([place])   . Chronic bronchitis ([place])   . Chronic headaches   . CKD (chronic kidney disease)   . Depressive disorder, not  elsewhere classified   . GERD (gastroesophageal reflux disease)   . HBP (high blood pressure)   . Laceration of face, multiple sites   . Lesion of lung   . Morbid obesity ([place])   . Multiple rib fractures   . Other diseases of lung, not elsewhere classified   . PNA (pneumonia)   . Sepsis ([place])   . Shortness of breath   . Steatohepatitis   . Type II or unspecified type diabetes mellitus without mention of complication, uncontrolled   . Unspecified essential hypertension     Past Surgical History:  Procedure Laterality Date  . circumcision for Chronic Balanitis  2009  . COLONOSCOPY WITH ESOPHAGOGASTRODUODENOSCOPY (EGD)  04/08/2014  . cystoscopic left Ureteral stone removal  1996  . LUMBAR LAMINECTOMY  1976  . NEPHROSTOMY    . small intestine endoscopy  04/08/2014     reports that he has never smoked. He has never used smokeless tobacco. He reports that he does not drink alcohol or use drugs.  Allergies  Allergen Reactions  . Sulfa Antibiotics Nausea And Vomiting    unknown  . Meperidine Other (See Comments)    "makes me crazy"    Family History  Problem Relation Age of Onset  . Colon cancer Neg Hx     Prior to Admission medications   Medication Sig Start Date End Date Taking? Authorizing Provider  albuterol (PROVENTIL,VENTOLIN) 90 MCG/ACT inhaler  Inhale 2 puffs into the lungs every 6 (six) hours as needed for wheezing or shortness of breath.    Yes [provider]  ALPRAZolam Duanne Moron) 1 MG tablet Take 0.5 tablets (0.5 mg total) by mouth 2 (two) times daily as needed for anxiety. 10/21/16  Yes Rexene Alberts, MD  amitriptyline (ELAVIL) 50 MG tablet Take 1 tablet (50 mg total) by mouth at bedtime. Patient taking differently: Take 100 mg by mouth at bedtime.  10/21/16  Yes Rexene Alberts, MD  aspirin 81 MG chewable tablet Chew 81 mg by mouth daily.   Yes [provider]  furosemide (LASIX) 40 MG tablet Take 1 tablet by mouth daily. 10/06/16  Yes [provider]  gabapentin (NEURONTIN) 300 MG capsule Take 600 mg by mouth 2 (two) times daily.    Yes [provider]  INCRUSE ELLIPTA 62.5 MCG/INH AEPB Inhale 1 puff into the lungs daily. 08/31/16  Yes [provider]  insulin glargine (LANTUS) 100 UNIT/ML injection Inject 0.5 mLs (50 Units total) into the skin 3 (three) times daily. DO NOT TAKE INSULIN IF YOUR BLOOD SUGAR IS LESS THAN 120. Patient taking differently: Inject 70 Units into the skin 3 (three) times daily. DO NOT TAKE INSULIN IF YOUR BLOOD SUGAR IS LESS THAN 120. 10/21/16  Yes Rexene Alberts, MD  metoCLOPramide (REGLAN) 10 MG tablet Take 1 tablet (10 mg total) by mouth 2 (two) times daily. Before meals and at bedtime Patient taking differently: Take 10 mg by mouth every 6 (six) hours as needed for nausea or vomiting.  10/21/16  Yes Rexene Alberts, MD  Multiple Vitamin (MULTIVITAMIN) tablet Take 1 tablet by mouth daily.   Yes [provider]  oxyCODONE-acetaminophen (PERCOCET) 10-325 MG tablet Take 0.5 tablets by mouth every 4 (four) hours as needed for pain. Patient taking differently: Take 1 tablet by mouth 5 (five) times daily.  10/21/16  Yes Rexene Alberts, MD  pantoprazole (PROTONIX) 40 MG tablet Take 40 mg by mouth daily. 11/11/17  Yes [provider]  PARoxetine (PAXIL) 40 MG tablet Take 40 mg by mouth every morning.     Yes [provider]  tamsulosin (FLOMAX) 0.4 MG CAPS capsule Take 1 capsule by mouth daily. 10/01/18  Yes [provider]    Physical Exam: Vitals:   10/15/18 0032 10/15/18 0045 10/15/18 0106 10/15/18 0158  BP: 109/71 118/72 107/64 114/72  Pulse: 92 86 92 96  Resp: (!) 23 19  16   Temp:    98.2 F (36.8 C)  TempSrc:    Oral  SpO2: 97% 94% 95% 100%  Weight:    105.9 kg  Height:    6' 1"  (1.854 m)      Constitutional: Moderately built and nourished. Vitals:   10/15/18 0032 10/15/18 0045 10/15/18 0106 10/15/18 0158  BP: 109/71 118/72 107/64 114/72    Pulse: 92 86 92 96  Resp: (!) 23 19  16   Temp:    98.2 F (36.8 C)  TempSrc:    Oral  SpO2: 97% 94% 95% 100%  Weight:    105.9 kg  Height:    6' 1"  (1.854 m)   Eyes: Anicteric no pallor. ENMT: No discharge from the ears eyes nose or mouth. Neck: No mass felt.  No neck rigidity.  No JVD appreciated. Respiratory: No rhonchi or crepitations. Cardiovascular: S1-S2 heard. Abdomen: Soft nontender bowel sounds present. Musculoskeletal: No edema. Skin: Multiple bruises. Neurologic: Alert awake oriented to time place and person.  Moves all extremities.  Psychiatric: Appears normal per normal affect.   Labs on Admission: I have personally reviewed following labs and imaging studies  CBC: Recent Labs  Lab 10/17/2018 1957  WBC 15.9*  NEUTROABS 14.5*  HGB 9.7*  HCT 30.4*  MCV 86.9  PLT 035   Basic Metabolic Panel: Recent Labs  Lab 10/11/2018 1957 10/25/2018 2225  NA 130*  --   K 3.7  --   CL 90*  --   CO2 20*  --   GLUCOSE 224*  --   BUN 114*  --   CREATININE 6.12*  --   CALCIUM 7.3*  --   MG  --  2.6*  PHOS  --  7.1*   GFR: Estimated Creatinine Clearance: 15.6 mL/min (A) (by C-G formula based on SCr of 6.12 mg/dL (H)). Liver Function Tests: Recent Labs  Lab 10/10/2018 1957  AST 1,474*  ALT 800*  ALKPHOS 911*  BILITOT 5.8*  PROT 7.5  ALBUMIN 2.8*   No results for input(s): LIPASE, AMYLASE in the last 168 hours. Recent Labs  Lab 09/26/2018 2225  AMMONIA 23   Coagulation Profile: Recent Labs  Lab 09/30/2018 1957  INR 1.29   Cardiac Enzymes: Recent Labs  Lab 10/06/2018 2225  CKTOTAL 56   BNP (last 3 results) No results for input(s): PROBNP in the last 8760 hours. HbA1C: No results for input(s): HGBA1C in the last 72 hours. CBG: No results for input(s): GLUCAP in the last 168 hours. Lipid Profile: No results for input(s): CHOL, HDL, LDLCALC, TRIG, CHOLHDL, LDLDIRECT in the last 72 hours. Thyroid Function Tests: Recent Labs    10/21/2018 2225  TSH 0.268*    Anemia Panel: No results for input(s): VITAMINB12, FOLATE, FERRITIN, TIBC, IRON, RETICCTPCT in the last 72 hours. Urine analysis:    Component Value Date/Time   COLORURINE AMBER (A) 10/19/2018 2250   APPEARANCEUR HAZY (A) 10/24/2018 2250   LABSPEC 1.011 10/06/2018 2250   PHURINE 5.0 10/01/2018 2250   GLUCOSEU NEGATIVE 10/11/2018 2250   HGBUR SMALL (A) 10/06/2018 2250   BILIRUBINUR NEGATIVE 10/21/2018 2250   KETONESUR NEGATIVE 09/30/2018 2250   PROTEINUR 30 (A) 10/25/2018 2250   NITRITE NEGATIVE 10/15/2018 2250   LEUKOCYTESUR NEGATIVE 10/09/2018 2250   Sepsis Labs: @LABRCNTIP (procalcitonin:4,lacticidven:4) )No results found for this or any previous visit (from the past 240 hour(s)).   Radiological Exams on Admission: Ct Head Wo Contrast  Result Date: 10/15/2018 CLINICAL DATA:  Multiple falls over the past 3 weeks. EXAM: CT HEAD WITHOUT CONTRAST TECHNIQUE: Contiguous axial images were obtained from the base of the skull through the vertex without intravenous contrast. COMPARISON:  Head CT and brain MRI 08/26/2018. FINDINGS: Brain: No evidence of acute infarction, hemorrhage, hydrocephalus, extra-axial collection or mass lesion/mass effect. There is some cortical atrophy. Vascular: No hyperdense vessel or unexpected calcification. Skull: Intact.  No focal lesion. Sinuses/Orbits: Negative. Other: None. IMPRESSION: No acute abnormality. Mild cortical atrophy. Electronically Signed   By: Inge Rise M.D.   On: 10/08/2018 20:29   Dg Chest Port 1 View  Result Date: 10/25/2018 CLINICAL DATA:  Cough, short of breath EXAM: PORTABLE CHEST 1 VIEW COMPARISON:  10/10/2018, 08/26/2018 FINDINGS: Elevation of left diaphragm. Linear scarring or atelectasis at the right base. No consolidation or effusion. Stable cardiomediastinal silhouette. No pneumothorax. IMPRESSION: No active disease. Stable elevation of left diaphragm with basilar atelectasis or scarring Electronically Signed   By: Donavan Foil M.D.   On: 09/28/2018 21:59   Ct Renal Stone Study  Result Date: 10/15/2018 CLINICAL  DATA:  63 year old male with abdominal distention. EXAM: CT ABDOMEN AND PELVIS WITHOUT CONTRAST TECHNIQUE: Multidetector CT imaging of the abdomen and pelvis was performed following the standard protocol without IV contrast. COMPARISON:  Abdominal radiograph dated 10/10/2018 and CT dated 11/28/2017 FINDINGS: Evaluation of this exam is limited in the absence of intravenous contrast. Lower chest: There is eventration of the left hemidiaphragm. There are bibasilar atelectasis/scarring. Evaluation of the lungs is limited due to respiratory motion artifact. There is shift of the mediastinum to the right of the midline secondary to mass effect caused by elevated left hemidiaphragm. This is similar to prior CT. No intra-abdominal free air. Small perihepatic free fluid. Hepatobiliary: The liver is unremarkable as visualized. No intrahepatic biliary ductal dilatation. The gallbladder is distended and contains slightly high attenuating content, likely hyper concentrated bile or sludge. There is diffuse haziness of the wall of the gallbladder and pericholecystic fat concerning for acute cholecystitis. Further evaluation with ultrasound recommended. Pancreas: Unremarkable. No pancreatic ductal dilatation or surrounding inflammatory changes. Spleen: Normal in size without focal abnormality. Adrenals/Urinary Tract: The adrenal glands are unremarkable. There is no hydronephrosis or nephrolithiasis on either side. There has been interval removal of the previously seen percutaneous nephrostomy tubes and staghorn calculi. A 2 cm exophytic hypodense lesion from the posterior interpolar left kidney is not characterized but appears similar to prior CT. The visualized ureters appear unremarkable. The urinary bladder is decompressed around a Foley catheter. Stomach/Bowel: There is large amount of dense stool throughout the colon and in the  rectum. The sigmoid colon is redundant. There is air distention of the transverse colon measuring up to 8.5 cm in diameter. No evidence of mechanical obstruction. The small bowel is unremarkable. No evidence of acute appendicitis. Vascular/Lymphatic: The abdominal aorta and IVC are grossly unremarkable on this noncontrast CT. No portal venous gas. There is no adenopathy. Reproductive: The prostate and seminal vesicles are are grossly unremarkable. No pelvic mass. Other: None Musculoskeletal: Osteopenia with degenerative changes of the spine. Multilevel compression fracture primarily at L3 and L5 similar to prior CT. There is vertebroplasty changes at L3. No acute fracture. IMPRESSION: 1. Distended gallbladder with findings concerning for acute cholecystitis. Further evaluation with ultrasound recommended. 2. Severe constipation with possible fecal impaction within the rectum. No bowel obstruction. 3. Interval removal of the previously seen percutaneous nephrostomy tubes and staghorn calculi. No hydronephrosis or nephrolithiasis. Electronically Signed   By: Anner Crete M.D.   On: 10/15/2018 00:06    EKG: Independently reviewed.  Normal sinus rhythm with history changes in inferior leads and multiple PVCs.  Assessment/Plan Active Problems:   COPD (chronic obstructive pulmonary disease) ([facility])   Chronic use of opiate for therapeutic purpose   Diabetes mellitus with nephropathy ([place])   ARF (acute renal failure) ([facility])   Urinary retention   Lower urinary tract infectious disease   Hepatitis   Frequent falls   Bronchitis   Acute renal failure superimposed on chronic kidney disease ([place])    1. Markedly elevated LFTs with CAT scan showing features concerning for acute cholecystitis -on exam patient abdomen appears benign.  I have ordered HIDA scan and also acute hepatitis panel and Tylenol levels are negative.  I have consulted general surgery.  We will also get MRCP.  Follow LFTs.  We will keep  patient n.p.o.  2. Acute on chronic kidney disease stage IV with patient at this time placed on Foley and is making urine.  Will hold Lasix and continue with hydration.  Closely follow  intake output metabolic panel. 3. COPD with possible bronchitis for which patient is on nebulizer treatment and Pulmicort.  Chest x-ray is unremarkable.  Patient's CAT scan is read as the previously seen nephrostomy tube has been removed.  Patient has had an admission in [facility] him in October as per the care everywhere notes from primary care.  Will need to get records from [facility] him. 4. History of diastolic CHF last EF measured was 55 to 60% with grade 1 diastolic dysfunction.  Will hold Lasix due to acute renal failure.  Patient is receiving fluids. 5. Multiple falls with multiple ecchymotic areas.  X-ray pelvis pending.  Will need physical therapy and likely may need rehab.  6. Diabetes mellitus type 2 patient takes Lantus 70 units twice daily for now I have kept patient on sliding scale coverage due to acute renal failure and n.p.o. status.  Follow metabolic panel closely. 7. History of chronic pain anxiety and depression.  Holding all medications for now.  Patient's records from [place] him home will need to be obtained as CAT scan read shows possibility of a nephrostomy tube was recently placed.  Patient does not recall this.  I have also ordered MRCP and 2D echo.   DVT prophylaxis: SCDs. Code Status: Full code. Family Communication: No family at the bedside. Disposition Plan: To be determined. Consults called: General surgery. Admission status: Inpatient.   Rise Patience MD [facility] Pager [phone].  If 7PM-7AM, please contact night-coverage [email or web address] Password [email or web address]  10/15/2018, 2:09 AM

## 2018-10-15 NOTE — Care Management Note (Signed)
Case Management Note  Patient Details  Name: Javier Ho. MRN: [id number] Date of Birth: March 31, 1956  Subjective/Objective: 63 yo male presented with frequent falls and SOB.                   Action/Plan: CM following for dispositional needs. Patient is a poor historian and have been experiencing multiple falls at home per H/P. Awaiting PT eval for recommendations, with CM team to continue following.   Expected Discharge Date:                  Expected Discharge Plan:  [place]  In-House Referral:  NA  Discharge planning Services  CM Consult  Post Acute Care Choice:  NA Choice offered to:  NA  DME Arranged:  N/A DME Agency:  NA  HH Arranged:  NA HH Agency:  NA  Status of Service:  In process, will continue to follow  If discussed at Long Length of Stay Meetings, dates discussed:    Additional Comments:  Midge Minium RN, BSN, NCM-BC, ACM-RN [phone] 10/15/2018, 1:14 PM

## 2018-10-16 ENCOUNTER — Inpatient Hospital Stay (HOSPITAL_COMMUNITY): Payer: Medicare HMO

## 2018-10-16 DIAGNOSIS — Z79891 Long term (current) use of opiate analgesic: Secondary | ICD-10-CM

## 2018-10-16 DIAGNOSIS — W19XXXS Unspecified fall, sequela: Secondary | ICD-10-CM

## 2018-10-16 DIAGNOSIS — R945 Abnormal results of liver function studies: Secondary | ICD-10-CM

## 2018-10-16 LAB — GLUCOSE, CAPILLARY
Glucose-Capillary: 108 mg/dL — ABNORMAL HIGH (ref 70–99)
Glucose-Capillary: 125 mg/dL — ABNORMAL HIGH (ref 70–99)
Glucose-Capillary: 129 mg/dL — ABNORMAL HIGH (ref 70–99)
Glucose-Capillary: 148 mg/dL — ABNORMAL HIGH (ref 70–99)
Glucose-Capillary: 152 mg/dL — ABNORMAL HIGH (ref 70–99)
Glucose-Capillary: 153 mg/dL — ABNORMAL HIGH (ref 70–99)
Glucose-Capillary: 158 mg/dL — ABNORMAL HIGH (ref 70–99)

## 2018-10-16 LAB — CBC
HEMATOCRIT: 27.6 % — AB (ref 39.0–52.0)
Hemoglobin: 9.3 g/dL — ABNORMAL LOW (ref 13.0–17.0)
MCH: 28.2 pg (ref 26.0–34.0)
MCHC: 33.7 g/dL (ref 30.0–36.0)
MCV: 83.6 fL (ref 80.0–100.0)
Platelets: 235 10*3/uL (ref 150–400)
RBC: 3.3 MIL/uL — ABNORMAL LOW (ref 4.22–5.81)
RDW: 14.6 % (ref 11.5–15.5)
WBC: 16.9 10*3/uL — ABNORMAL HIGH (ref 4.0–10.5)
nRBC: 0.1 % (ref 0.0–0.2)

## 2018-10-16 LAB — COMPREHENSIVE METABOLIC PANEL
ALT: 854 U/L — ABNORMAL HIGH (ref 0–44)
AST: 2002 U/L — ABNORMAL HIGH (ref 15–41)
Albumin: 2.3 g/dL — ABNORMAL LOW (ref 3.5–5.0)
Alkaline Phosphatase: 1331 U/L — ABNORMAL HIGH (ref 38–126)
Anion gap: 22 — ABNORMAL HIGH (ref 5–15)
BUN: 131 mg/dL — ABNORMAL HIGH (ref 8–23)
CO2: 21 mmol/L — ABNORMAL LOW (ref 22–32)
Calcium: 6.4 mg/dL — CL (ref 8.9–10.3)
Chloride: 92 mmol/L — ABNORMAL LOW (ref 98–111)
Creatinine, Ser: 6.45 mg/dL — ABNORMAL HIGH (ref 0.61–1.24)
GFR calc Af Amer: 10 mL/min — ABNORMAL LOW (ref >60)
GFR, EST NON AFRICAN AMERICAN: 8 mL/min — AB (ref >60)
Glucose, Bld: 145 mg/dL — ABNORMAL HIGH (ref 70–99)
Potassium: 3.6 mmol/L (ref 3.5–5.1)
Sodium: 135 mmol/L (ref 135–145)
Total Bilirubin: 7.2 mg/dL — ABNORMAL HIGH (ref 0.3–1.2)
Total Protein: 6.9 g/dL (ref 6.5–8.1)

## 2018-10-16 LAB — HEPATITIS PANEL, ACUTE
HCV Ab: 5.5 s/co ratio — ABNORMAL HIGH (ref 0.0–0.9)
HEP B C IGM: NEGATIVE
Hep A IgM: NEGATIVE
Hepatitis B Surface Ag: NEGATIVE

## 2018-10-16 LAB — URINE CULTURE: Culture: >=100000 — AB

## 2018-10-16 LAB — PHOSPHORUS: Phosphorus: 9.7 mg/dL — ABNORMAL HIGH (ref 2.5–4.6)

## 2018-10-16 MED ORDER — LORAZEPAM 2 MG/ML IJ SOLN
1.0000 mg | Freq: Once | INTRAMUSCULAR | Status: DC
  Filled 2018-10-16: qty 1

## 2018-10-16 MED ORDER — IPRATROPIUM-ALBUTEROL 0.5-2.5 (3) MG/3ML IN SOLN
3.0000 mL | Freq: Two times a day (BID) | RESPIRATORY_TRACT | Status: DC
  Administered 2018-10-16 – 2018-10-19 (×6): 3 mL via RESPIRATORY_TRACT
  Filled 2018-10-16 (×6): qty 3

## 2018-10-16 MED ORDER — LORAZEPAM 2 MG/ML IJ SOLN
1.0000 mg | Freq: Once | INTRAMUSCULAR | Status: AC | PRN
  Administered 2018-10-16: 1 mg via INTRAVENOUS
  Filled 2018-10-16: qty 1

## 2018-10-16 NOTE — Progress Notes (Signed)
PROGRESS NOTE  Tenny Craw. QTM:[id number] DOB: March 11, 1956 DOA: 09/29/2018 PCP: Deloria Lair., MD   LOS: 2 days   Brief Narrative / Interim history: 63 y.o.malewithhistory of COPD, diastolic CHF, chronic kidney disease stage IV, chronic anemia, diabetes mellitus type 2, anxiety and depression admitted with weakness and multiple falls the last 1 week. On admission, found to have markedly elevated liver enzymes, worsening renal function with uremia and nonspecific EKG changes with multiple PVCs.  CT head without acute intracranial finding.  Portable chest x-ray without acute cardiopulmonary finding.  CT renal stone revealed distended gallbladder and severe constipation possible fecal impaction.  HIDA positive.  Patient could not tolerate MRCP. General surgery, GI and nephrology consulted.  Subjective: -Remains slightly confused, no complaints this morning.  No chest pain, no shortness of breath.  No abdominal pain, no nausea or vomiting  Assessment & Plan: Active Problems:   COPD (chronic obstructive pulmonary disease) (HCC)   Chronic use of opiate for therapeutic purpose   Diabetes mellitus with nephropathy ([place])   ARF (acute renal failure) (HCC)   Urinary retention   Lower urinary tract infectious disease   Hepatitis   Frequent falls   Bronchitis   Acute renal failure superimposed on chronic kidney disease (HCC)   Principal Problem Markedly elevated liver enzymes/hyperbilirubinemia/alkaline phosphatase: concern for cholecystitis/biliary obstruction  -General surgery consulted, discussed with Brigid Re, PA, he does not appear that gallbladder is an issue even with positive HIDA scan, suspect abnormal HIDA scan because of increasing liver issues -LFTs are getting worse today, bilirubin and alk phos are getting worse, consulted GI, discussed with Dr. Collene Mares, appreciate input. -Hep C antibody positive, RNA PCR pending  Active Problems AKI on CKD/uremia/hyperphosphatemia    -CT renal stone study did not reveal any obstructive etiology.  Only on low-dose aspirin and Lasix at home. -Neurology following -Renal function is getting worse today  Anion gap metabolic acidosis -Likely due to uremia and renal failure as above -Manage as above  Chronic diastolic CHF -EF 55 to 62% with G1 DD.  No signs of fluid overload on exam.  No respiratory distress. -Monitor, urine output 1600 cc over the last 24 hours.  Overall about even since admission  Multiple falls -Could be due to the above.  CT head without acute intracranial finding.  DM2  -Last A1c 7.9% in 2018.  On 50 units of Lantus at home. -Continue sliding scale  COPD -No wheezing, stable -Continue breathing treatments.  Multiple PVCs -Likely due to electrolyte issues with renal failure.   Scheduled Meds: . budesonide (PULMICORT) nebulizer solution  0.25 mg Nebulization BID  . insulin aspart  0-9 Units Subcutaneous Q4H  . ipratropium-albuterol  3 mL Nebulization TID   Continuous Infusions: . piperacillin-tazobactam (ZOSYN)  IV Stopped (10/16/18 0556)  .  sodium bicarbonate (isotonic) infusion in sterile water 150 mL/hr at 10/16/18 1007   PRN Meds:.hydrALAZINE, ipratropium-albuterol, LORazepam, ondansetron **OR** ondansetron (ZOFRAN) IV  DVT prophylaxis: SCDs Code Status: Full code Family Communication: no family at bedside  Disposition Plan: TBD  Consultants:   GI  Surgery  Nephrology   Procedures:   2D echo Impressions: - Technically difficult study. LVEF 65-70%, normal wall thickness, normal wall motion, grade 1 DD, indeterminate LV filling pressure, normal LA size, mild RAE  Antimicrobials:  Zosyn 1/20 >>   Objective: Vitals:   10/16/18 0404 10/16/18 0725 10/16/18 0755 10/16/18 1124  BP: 116/74  132/69 (!) 113/58  Pulse: 90  91   Resp:   (!)  23 (!) 23  Temp: (!) 97.4 F (36.3 C)  97.6 F (36.4 C) 97.8 F (36.6 C)  TempSrc: Oral  Oral Oral  SpO2: 95% 96% 98% 97%   Weight: 106.9 kg     Height:        Intake/Output Summary (Last 24 hours) at 10/16/2018 1142 Last data filed at 10/16/2018 1012 Gross per 24 hour  Intake 2309.64 ml  Output 1600 ml  Net 709.64 ml   Filed Weights   10/22/2018 1956 10/15/18 0158 10/16/18 0404  Weight: 110.2 kg 105.9 kg 106.9 kg    Examination:  Constitutional: NAD Eyes: PERRL ENMT: Mucous membranes are moist. No oropharyngeal exudates Neck: normal, supple Respiratory: clear to auscultation bilaterally, no wheezing, no crackles. Normal respiratory effort. No accessory muscle use.  Cardiovascular: Regular rate and rhythm, no murmurs / rubs / gallops. No LE edema.  Abdomen: no tenderness. Bowel sounds positive.  Musculoskeletal: no clubbing / cyanosis.  Skin: no rashes Neurologic: non focal   Data Reviewed: I have independently reviewed following labs and imaging studies   CBC: Recent Labs  Lab 10/06/2018 1957 10/15/18 0723 10/15/18 1358 10/16/18 0419  WBC 15.9* 14.8*  --  16.9*  NEUTROABS 14.5* 13.5*  --   --   HGB 9.7* 9.2*  --  9.3*  HCT 30.4* 28.1*  --  27.6*  MCV 86.9 85.2  --  83.6  PLT 233 210 212 638   Basic Metabolic Panel: Recent Labs  Lab 10/24/2018 1957 10/17/2018 2225 10/15/18 0723 10/16/18 0419  NA 130*  --  131* 135  K 3.7  --  4.0 3.6  CL 90*  --  95* 92*  CO2 20*  --  15* 21*  GLUCOSE 224*  --  168* 145*  BUN 114*  --  121* 131*  CREATININE 6.12*  --  6.10* 6.45*  CALCIUM 7.3*  --  6.9* 6.4*  MG  --  2.6*  --   --   PHOS  --  7.1*  --  9.7*   GFR: Estimated Creatinine Clearance: 14.8 mL/min (A) (by C-G formula based on SCr of 6.45 mg/dL (H)). Liver Function Tests: Recent Labs  Lab 09/27/2018 1957 10/15/18 0723 10/16/18 0419  AST 1,474* 1,475* 2,002*  ALT 800* 737* 854*  ALKPHOS 911* 890* 1,331*  BILITOT 5.8* 5.6* 7.2*  PROT 7.5 6.4* 6.9  ALBUMIN 2.8* 2.3* 2.3*   No results for input(s): LIPASE, AMYLASE in the last 168 hours. Recent Labs  Lab 10/21/2018 2225  AMMONIA 23    Coagulation Profile: Recent Labs  Lab 10/06/2018 1957 10/15/18 1358  INR 1.29 1.34   Cardiac Enzymes: Recent Labs  Lab 10/25/2018 2225 10/15/18 0723 10/15/18 1705 10/15/18 1933  CKTOTAL 56 58  --   --   CKMB  --  1.9  --   --   TROPONINI  --  0.06* 0.05* 0.04*   BNP (last 3 results) No results for input(s): PROBNP in the last 8760 hours. HbA1C: Recent Labs    10/15/18 1358  HGBA1C 7.7*   CBG: Recent Labs  Lab 10/15/18 2003 10/16/18 0003 10/16/18 0401 10/16/18 0759 10/16/18 1123  GLUCAP 110* 125* 148* 158* 153*   Lipid Profile: No results for input(s): CHOL, HDL, LDLCALC, TRIG, CHOLHDL, LDLDIRECT in the last 72 hours. Thyroid Function Tests: Recent Labs    10/03/2018 2225  TSH 0.268*   Anemia Panel: Recent Labs    10/15/18 1933  FERRITIN 984*  TIBC 216*  IRON 36*  Urine analysis:    Component Value Date/Time   COLORURINE AMBER (A) 10/20/2018 2250   APPEARANCEUR HAZY (A) 09/30/2018 2250   LABSPEC 1.011 09/30/2018 2250   PHURINE 5.0 10/05/2018 2250   GLUCOSEU NEGATIVE 10/20/2018 2250   HGBUR SMALL (A) 10/04/2018 2250   BILIRUBINUR NEGATIVE 10/13/2018 2250   KETONESUR NEGATIVE 10/18/2018 2250   PROTEINUR 30 (A) 10/13/2018 2250   NITRITE NEGATIVE 10/07/2018 2250   LEUKOCYTESUR NEGATIVE 10/08/2018 2250   Sepsis Labs: Invalid input(s): PROCALCITONIN, LACTICIDVEN  Recent Results (from the past 240 hour(s))  Culture, blood (routine x 2)     Status: None (Preliminary result)   Collection Time: 10/03/2018 10:26 PM  Result Value Ref Range Status   Specimen Description BLOOD SITE NOT SPECIFIED  Final   Special Requests   Final    BOTTLES DRAWN AEROBIC AND ANAEROBIC Blood Culture adequate volume   Culture   Final    NO GROWTH 1 DAY Performed at [facility], [address], [place], [place] [address]    Report Status PENDING  Incomplete  Culture, blood (routine x 2)     Status: None (Preliminary result)   Collection Time: 10/15/18 12:24 AM   Result Value Ref Range Status   Specimen Description BLOOD LEFT HAND  Final   Special Requests   Final    BOTTLES DRAWN AEROBIC AND ANAEROBIC Blood Culture results may not be optimal due to an inadequate volume of blood received in culture bottles   Culture   Final    NO GROWTH 1 DAY Performed at [facility], [place] [address], [place], [place] [address]    Report Status PENDING  Incomplete      Radiology Studies: Ct Head Wo Contrast  Result Date: 10/13/2018 CLINICAL DATA:  Multiple falls over the past 3 weeks. EXAM: CT HEAD WITHOUT CONTRAST TECHNIQUE: Contiguous axial images were obtained from the base of the skull through the vertex without intravenous contrast. COMPARISON:  Head CT and brain MRI 08/26/2018. FINDINGS: Brain: No evidence of acute infarction, hemorrhage, hydrocephalus, extra-axial collection or mass lesion/mass effect. There is some cortical atrophy. Vascular: No hyperdense vessel or unexpected calcification. Skull: Intact.  No focal lesion. Sinuses/Orbits: Negative. Other: None. IMPRESSION: No acute abnormality. Mild cortical atrophy. Electronically Signed   By: Inge Rise M.D.   On: 09/25/2018 20:29   Nm Hepatobiliary Liver Func  Result Date: 10/15/2018 CLINICAL DATA:  Abdominal pain.  Abnormal CT scan. EXAM: NUCLEAR MEDICINE HEPATOBILIARY IMAGING TECHNIQUE: Sequential images of the abdomen were obtained out to 60 minutes following intravenous administration of radiopharmaceutical. RADIOPHARMACEUTICALS:  5.4 mCi Tc-12m Choletec IV COMPARISON:  CT scan 10/14/2017 FINDINGS: Fairly prompt but somewhat heterogeneous uptake in the liver. The biliary tree is not visualized for certain and the gallbladder is never visualized at 2 hours. I do not see any obvious activity in the bowel. IMPRESSION: Findings suggest biliary obstruction with non visualization of the biliary tree and gallbladder at 2 hours. Electronically Signed   By: PMarijo SanesM.D.   On: 10/15/2018 10:23    Dg Pelvis Portable  Result Date: 10/15/2018 CLINICAL DATA:  Recent fall with pelvic pain, initial encounter EXAM: PORTABLE PELVIS 1-2 VIEWS COMPARISON:  CT from the previous day. FINDINGS: A portion of the pelvis is obscured by overlying bowel content. No acute fracture or dislocation is noted. Degenerative change of the hip joints and lumbar spine are seen. Scattered distended colon with fecal material within is again identified and stable. IMPRESSION: No  acute fracture noted. Electronically Signed   By: Inez Catalina M.D.   On: 10/15/2018 10:18   Dg Chest Port 1 View  Result Date: 09/28/2018 CLINICAL DATA:  Cough, short of breath EXAM: PORTABLE CHEST 1 VIEW COMPARISON:  10/10/2018, 08/26/2018 FINDINGS: Elevation of left diaphragm. Linear scarring or atelectasis at the right base. No consolidation or effusion. Stable cardiomediastinal silhouette. No pneumothorax. IMPRESSION: No active disease. Stable elevation of left diaphragm with basilar atelectasis or scarring Electronically Signed   By: Donavan Foil M.D.   On: 10/13/2018 21:59   Ct Renal Stone Study  Result Date: 10/15/2018 CLINICAL DATA:  63 year old male with abdominal distention. EXAM: CT ABDOMEN AND PELVIS WITHOUT CONTRAST TECHNIQUE: Multidetector CT imaging of the abdomen and pelvis was performed following the standard protocol without IV contrast. COMPARISON:  Abdominal radiograph dated 10/10/2018 and CT dated 11/28/2017 FINDINGS: Evaluation of this exam is limited in the absence of intravenous contrast. Lower chest: There is eventration of the left hemidiaphragm. There are bibasilar atelectasis/scarring. Evaluation of the lungs is limited due to respiratory motion artifact. There is shift of the mediastinum to the right of the midline secondary to mass effect caused by elevated left hemidiaphragm. This is similar to prior CT. No intra-abdominal free air. Small perihepatic free fluid. Hepatobiliary: The liver is unremarkable as visualized.  No intrahepatic biliary ductal dilatation. The gallbladder is distended and contains slightly high attenuating content, likely hyper concentrated bile or sludge. There is diffuse haziness of the wall of the gallbladder and pericholecystic fat concerning for acute cholecystitis. Further evaluation with ultrasound recommended. Pancreas: Unremarkable. No pancreatic ductal dilatation or surrounding inflammatory changes. Spleen: Normal in size without focal abnormality. Adrenals/Urinary Tract: The adrenal glands are unremarkable. There is no hydronephrosis or nephrolithiasis on either side. There has been interval removal of the previously seen percutaneous nephrostomy tubes and staghorn calculi. A 2 cm exophytic hypodense lesion from the posterior interpolar left kidney is not characterized but appears similar to prior CT. The visualized ureters appear unremarkable. The urinary bladder is decompressed around a Foley catheter. Stomach/Bowel: There is large amount of dense stool throughout the colon and in the rectum. The sigmoid colon is redundant. There is air distention of the transverse colon measuring up to 8.5 cm in diameter. No evidence of mechanical obstruction. The small bowel is unremarkable. No evidence of acute appendicitis. Vascular/Lymphatic: The abdominal aorta and IVC are grossly unremarkable on this noncontrast CT. No portal venous gas. There is no adenopathy. Reproductive: The prostate and seminal vesicles are are grossly unremarkable. No pelvic mass. Other: None Musculoskeletal: Osteopenia with degenerative changes of the spine. Multilevel compression fracture primarily at L3 and L5 similar to prior CT. There is vertebroplasty changes at L3. No acute fracture. IMPRESSION: 1. Distended gallbladder with findings concerning for acute cholecystitis. Further evaluation with ultrasound recommended. 2. Severe constipation with possible fecal impaction within the rectum. No bowel obstruction. 3. Interval  removal of the previously seen percutaneous nephrostomy tubes and staghorn calculi. No hydronephrosis or nephrolithiasis. Electronically Signed   By: Anner Crete M.D.   On: 10/15/2018 00:06   US Abdomen Limited Ruq  Result Date: 10/15/2018 CLINICAL DATA:  Elevated LFTs EXAM: ULTRASOUND ABDOMEN LIMITED RIGHT UPPER QUADRANT COMPARISON:  CT from the previous day. FINDINGS: Gallbladder: Well distended with mild gallbladder wall thickening and pericholecystic fluid. Negative sonographic Murphy's sign is noted. No cholelithiasis is seen. Common bile duct: Diameter: 3.9 mm. Liver: Diffuse heterogeneity is noted with mild nodularity consistent with cirrhotic change. Portal vein is  patent on color Doppler imaging with normal direction of blood flow towards the liver. IMPRESSION: Distended gallbladder with wall thickening although a negative sonographic Murphy sign is elicited. This could still represent cholecystitis in the appropriate clinical setting. Heterogeneity in the liver likely representing underlying hepatocellular disease. This could account for the poor uptake of radiotracer on the prior exam. No biliary ductal dilatation is noted. Electronically Signed   By: Inez Catalina M.D.   On: 10/15/2018 17:02     Marzetta Board, MD, PhD [facility]  Contact via  [email or web address]  [place] P: [phone]  F: [phone]

## 2018-10-16 NOTE — Final Consult Note (Signed)
Consultant Final Sign-Off Note    Assessment/Final recommendations  Javier Ho. is a 63 y.o. male followed by me for transaminitis and hyperbilirubinemia. Today ALP elevated to 1331, AST/ALT 2002/854, Tbili up to 7.2.  Initially consulted due to distended gallbladder seen on CT renal 1/21. HIDA 1/21 showed heterogenous uptake in the liver with non-visualization of biliary tree and gallbladder. Javier Ho shows underlying hepatocellular disease which accounts for poor uptake of radiotracer on HIDA. No cholelithiasis seen and gallbladder wall thickening is equivocal in setting of liver disease. Patient is non-tender on exam. Hepatitis panel positive for Hep C.   This is not a surgical issue. We do not feel this is likely to be acute cholecystitis and would not recommend percutaneous cholecystostomy at this time.   Wound care (if applicable): N/A   Diet at discharge: per primary team   Activity at discharge: per primary team   Follow-up appointment:  None needed   Pending results:  Unresulted Labs (From admission, onward)    Start     Ordered   10/16/18 0838  Urine rapid drug screen (hosp performed)  Once,   R     10/16/18 0838   10/18/2018 2312  Urine culture  ONCE - STAT,   STAT     10/04/2018 2312   10/16/2018 2145  Culture, blood (routine x 2)  BLOOD CULTURE X 2,   STAT     09/28/2018 2144           Medication recommendations: per primary    Other recommendations: GI consult for management of Hep C and transaminitis    Thank you for allowing Javier Ho to participate in the care of your patient!  Please consult Javier Ho again if you have further needs for your patient.  [name] 10/16/2018 9:03 AM    Subjective   Patient seems confused this AM. Answers some but not all questions for me. Does report that he has used drugs in the past, but does not elaborate any further.   Objective  Vital signs in last 24 hours: Temp:  [97.4 F (36.3 C)-98 F (36.7 C)] 97.6 F (36.4 C) (01/22  0755) Pulse Rate:  [81-93] 91 (01/22 0755) Resp:  [15-23] 23 (01/22 0755) BP: (116-132)/(68-83) 132/69 (01/22 0755) SpO2:  [89 %-98 %] 98 % (01/22 0755) Weight:  [106.9 kg] 106.9 kg (01/22 0404)  Physical Exam  Constitutional: He appears jaundiced (mildly ).  Non-toxic appearance. No distress.  HENT:  Mouth/Throat: Mucous membranes are dry.  Eyes: Lids are normal. Scleral icterus is present.  Neck: Normal range of motion. Neck supple.  Cardiovascular: Normal rate and regular rhythm.  Pulses:      Radial pulses are 2+ on the right side and 2+ on the left side.       Dorsalis pedis pulses are 2+ on the right side and 2+ on the left side.  Pulmonary/Chest: Effort normal and breath sounds normal.  Abdominal: Soft. Bowel sounds are normal. He exhibits distension (moderate). There is no abdominal tenderness. There is no rigidity, no rebound, no guarding and negative Murphy's sign.  Musculoskeletal:     Comments: Moving all 4 extremities  Neurological:  Intermittently following commands and answering questions appropriately     Pertinent labs and Studies: Recent Labs    10/16/2018 1957 10/15/18 0723 10/16/18 0419  WBC 15.9* 14.8* 16.9*  HGB 9.7* 9.2* 9.3*  HCT 30.4* 28.1* 27.6*   BMET Recent Labs    10/15/18 0723 10/16/18 0419  NA 131*  135  K 4.0 3.6  CL 95* 92*  CO2 15* 21*  GLUCOSE 168* 145*  BUN 121* 131*  CREATININE 6.10* 6.45*  CALCIUM 6.9* 6.4*   No results for input(s): LABURIN in the last 72 hours. Results for orders placed or performed during the hospital encounter of 11/28/17  Urine Culture     Status: Abnormal   Collection Time: 11/28/17  3:01 PM  Result Value Ref Range Status   Specimen Description   Final    KIDNEY LEFT Performed at [facility], [address] [place], [place] [address]    Special Requests   Final    NONE Performed at [facility], [address], [place], [place] [address]    Culture (A)  Final    >=100,000 COLONIES/mL PSEUDOMONAS  FLUORESCENS SEE SEPARATE REPORT FOR SUSCEPTIBILITY TESTING PERFORMED BY [facility] Performed at [facility], [place] [address], [place], [place] [address]    Report Status 12/08/2017 FINAL  Final  Urine Culture     Status: Abnormal   Collection Time: 11/28/17  3:01 PM  Result Value Ref Range Status   Specimen Description   Final    KIDNEY RIGHT Performed at [facility], [address], [place], [place] [address]    Special Requests   Final    NONE Performed at [facility], [address] [place], [place] [address]    Culture (A)  Final    >=100,000 COLONIES/mL PSEUDOMONAS FLUORESCENS >=100,000 COLONIES/mL CANDIDA GLABRATA    Report Status 12/03/2017 FINAL  Final   Organism ID, Bacteria PSEUDOMONAS FLUORESCENS (A)  Final      Susceptibility   Pseudomonas fluorescens - MIC*    CEFTAZIDIME 4 SENSITIVE Sensitive     CIPROFLOXACIN <=0.25 SENSITIVE Sensitive     GENTAMICIN <=1 SENSITIVE Sensitive     IMIPENEM <=0.25 SENSITIVE Sensitive     PIP/TAZO >=128 RESISTANT Resistant     CEFEPIME <=1 SENSITIVE Sensitive     * >=100,000 COLONIES/mL PSEUDOMONAS FLUORESCENS  Susceptibility, Aer + Anaerob     Status: None   Collection Time: 11/28/17  3:01 PM  Result Value Ref Range Status   Suscept, Aer + Anaerob Final report  Final    Comment: (NOTE) Performed At: [facility] [place], [place] [id number] Rush Farmer MD ME:[id number]    Source of Sample URINE, RANDOM  Final    Comment: Performed at [facility], [place] [address], [place], [place] [address]  Susceptibility Result     Status: Abnormal   Collection Time: 11/28/17  3:01 PM  Result Value Ref Range Status   Suscept Result 1 Comment (A)  Final    Comment: (NOTE) Pseudomonas fluorescens Identification performed by account, not confirmed by this laboratory.    Antimicrobial Suscept Comment  Final    Comment: (NOTE)      ** S = Susceptible; I = Intermediate; R = Resistant **                   P  = Positive; N = Negative            MICS are expressed in micrograms per mL   Antibiotic                 RSLT#1    RSLT#2    RSLT#3    RSLT#4 Amikacin                       S  Aztreonam                      R Cefepime                       S Cefotaxime                     I Ceftazidime                    S Ceftriaxone                    I Ciprofloxacin                  S Gentamicin                     S Imipenem                       S Levofloxacin                   S Meropenem                      S Piperacillin/Tazobactam        S Tetracycline                   S Ticarcillin/Clavulanate        R Tobramycin                     S Trimethoprim/Sulfa             R Performed At: [facility] [place], [place] [id number] Rush Farmer MD MH:[id number] Performed at [facility] [address] [place], [place] [address]     Imaging: Nm Hepatobiliary Liver Func  Result Date: 10/15/2018 CLINICAL DATA:  Abdominal pain.  Abnormal CT scan. EXAM: NUCLEAR MEDICINE HEPATOBILIARY IMAGING TECHNIQUE: Sequential images of the abdomen were obtained out to 60 minutes following intravenous administration of radiopharmaceutical. RADIOPHARMACEUTICALS:  5.4 mCi Tc-54m Choletec IV COMPARISON:  CT scan 10/14/2017 FINDINGS: Fairly prompt but somewhat heterogeneous uptake in the liver. The biliary tree is not visualized for certain and the gallbladder is never visualized at 2 hours. I do not see any obvious activity in the bowel. IMPRESSION: Findings suggest biliary obstruction with non visualization of the biliary tree and gallbladder at 2 hours. Electronically Signed   By: PMarijo SanesM.D.   On: 10/15/2018 10:23   Dg Pelvis Portable  Result Date: 10/15/2018 CLINICAL DATA:  Recent fall with pelvic pain, initial encounter EXAM: PORTABLE PELVIS 1-2 VIEWS COMPARISON:  CT from the previous day. FINDINGS: A portion of the pelvis is obscured by overlying bowel content. No acute  fracture or dislocation is noted. Degenerative change of the hip joints and lumbar spine are seen. Scattered distended colon with fecal material within is again identified and stable. IMPRESSION: No acute fracture noted. Electronically Signed   By: MInez CatalinaM.D.   On: 10/15/2018 10:18   UKoreaAbdomen Limited Ruq  Result Date: 10/15/2018 CLINICAL DATA:  Elevated LFTs EXAM: ULTRASOUND ABDOMEN LIMITED RIGHT UPPER QUADRANT COMPARISON:  CT from the previous day. FINDINGS: Gallbladder: Well distended with mild gallbladder wall thickening and pericholecystic fluid. Negative sonographic Murphy's sign is noted. No cholelithiasis is seen. Common bile duct: Diameter: 3.9 mm. Liver: Diffuse heterogeneity is  noted with mild nodularity consistent with cirrhotic change. Portal vein is patent on color Doppler imaging with normal direction of blood flow towards the liver. IMPRESSION: Distended gallbladder with wall thickening although a negative sonographic Murphy sign is elicited. This could still represent cholecystitis in the appropriate clinical setting. Heterogeneity in the liver likely representing underlying hepatocellular disease. This could account for the poor uptake of radiotracer on the prior exam. No biliary ductal dilatation is noted. Electronically Signed   By: Inez Catalina M.D.   On: 10/15/2018 17:02

## 2018-10-16 NOTE — Progress Notes (Signed)
Critical value from lab- Calcium 6.4 Paged [place] to [facility]) Claudine Mouton, RN

## 2018-10-16 NOTE — Progress Notes (Signed)
Patient ID: Javier Craw., male   DOB: Apr 13, 1956, 63 y.o.   MRN: [id number] [facility] Progress Note   Assessment/ Plan:   1.  Acute kidney injury on chronic kidney disease stage IV:  With worsening renal function noted on labs this morning however decent urine output and improvement of status noted in response to fluids overnight.  I suspect that this might be reversible injury and will continue conservative management at this time until we have indications for acute dialysis.  It is unclear at this point if he is truly progressed on to end-stage renal disease. 2.  Anion gap metabolic acidosis: Secondary to acute kidney injury/starvation ketosis-proving with sodium bicarbonate. 3.  Encephalopathy: Likely metabolic encephalopathy with multiple incriminating etiologies including acute liver injury/SIRS and possibly uremia.    Improved somewhat overnight. 4.    Awaited LFTs/bilirubin: With positive HIDA scan in the background of suggestive lab and clinical findings including markedly elevated transaminases, hyperbilirubinemia and abdominal tenderness.  On broad-spectrum antimicrobial coverage with Zosyn, not felt to be a surgical abdomen. 5.  History of diastolic heart failure: Without evidence of volume overload and in fact appears to be volume depleted on exam-we will continue intravenous fluids until cleared for oral intake. 6.  Anemia: Likely anemia of chronic kidney disease versus critical illness based on current presentation.    With low iron saturation however, avoiding iron at this time with acute liver injury.   Subjective:   Reports to be feeling thirsty and somewhat annoyed at being in the hospital.   Objective:   BP 132/69 (BP Location: Right Arm)   Pulse 91   Temp 97.6 F (36.4 C) (Oral)   Resp (!) 23   Ht 6' 1"  (1.854 m)   Wt 106.9 kg   SpO2 98%   BMI 31.09 kg/m   Intake/Output Summary (Last 24 hours) at 10/16/2018 1107 Last data filed at 10/16/2018  1012 Gross per 24 hour  Intake 2309.64 ml  Output 1600 ml  Net 709.64 ml   Weight change: -3.324 kg  Physical Exam: Gen: Appears to be comfortably resting in bed, listening to television CVS: Pulse regular rhythm, normal rate, S1 and S2 normal Resp: Anteriorly clear to auscultation, no rales/rhonchi Abd: Soft, obese, nontender Ext: No lower extremity edema  Imaging: Ct Head Wo Contrast  Result Date: 10/02/2018 CLINICAL DATA:  Multiple falls over the past 3 weeks. EXAM: CT HEAD WITHOUT CONTRAST TECHNIQUE: Contiguous axial images were obtained from the base of the skull through the vertex without intravenous contrast. COMPARISON:  Head CT and brain MRI 08/26/2018. FINDINGS: Brain: No evidence of acute infarction, hemorrhage, hydrocephalus, extra-axial collection or mass lesion/mass effect. There is some cortical atrophy. Vascular: No hyperdense vessel or unexpected calcification. Skull: Intact.  No focal lesion. Sinuses/Orbits: Negative. Other: None. IMPRESSION: No acute abnormality. Mild cortical atrophy. Electronically Signed   By: Inge Rise M.D.   On: 09/25/2018 20:29   Nm Hepatobiliary Liver Func  Result Date: 10/15/2018 CLINICAL DATA:  Abdominal pain.  Abnormal CT scan. EXAM: NUCLEAR MEDICINE HEPATOBILIARY IMAGING TECHNIQUE: Sequential images of the abdomen were obtained out to 60 minutes following intravenous administration of radiopharmaceutical. RADIOPHARMACEUTICALS:  5.4 mCi Tc-24m Choletec IV COMPARISON:  CT scan 10/14/2017 FINDINGS: Fairly prompt but somewhat heterogeneous uptake in the liver. The biliary tree is not visualized for certain and the gallbladder is never visualized at 2 hours. I do not see any obvious activity in the bowel. IMPRESSION: Findings suggest biliary obstruction with non  visualization of the biliary tree and gallbladder at 2 hours. Electronically Signed   By: Marijo Sanes M.D.   On: 10/15/2018 10:23   Dg Pelvis Portable  Result Date:  10/15/2018 CLINICAL DATA:  Recent fall with pelvic pain, initial encounter EXAM: PORTABLE PELVIS 1-2 VIEWS COMPARISON:  CT from the previous day. FINDINGS: A portion of the pelvis is obscured by overlying bowel content. No acute fracture or dislocation is noted. Degenerative change of the hip joints and lumbar spine are seen. Scattered distended colon with fecal material within is again identified and stable. IMPRESSION: No acute fracture noted. Electronically Signed   By: Inez Catalina M.D.   On: 10/15/2018 10:18   Dg Chest Port 1 View  Result Date: 09/27/2018 CLINICAL DATA:  Cough, short of breath EXAM: PORTABLE CHEST 1 VIEW COMPARISON:  10/10/2018, 08/26/2018 FINDINGS: Elevation of left diaphragm. Linear scarring or atelectasis at the right base. No consolidation or effusion. Stable cardiomediastinal silhouette. No pneumothorax. IMPRESSION: No active disease. Stable elevation of left diaphragm with basilar atelectasis or scarring Electronically Signed   By: Donavan Foil M.D.   On: 10/12/2018 21:59   Ct Renal Stone Study  Result Date: 10/15/2018 CLINICAL DATA:  63 year old male with abdominal distention. EXAM: CT ABDOMEN AND PELVIS WITHOUT CONTRAST TECHNIQUE: Multidetector CT imaging of the abdomen and pelvis was performed following the standard protocol without IV contrast. COMPARISON:  Abdominal radiograph dated 10/10/2018 and CT dated 11/28/2017 FINDINGS: Evaluation of this exam is limited in the absence of intravenous contrast. Lower chest: There is eventration of the left hemidiaphragm. There are bibasilar atelectasis/scarring. Evaluation of the lungs is limited due to respiratory motion artifact. There is shift of the mediastinum to the right of the midline secondary to mass effect caused by elevated left hemidiaphragm. This is similar to prior CT. No intra-abdominal free air. Small perihepatic free fluid. Hepatobiliary: The liver is unremarkable as visualized. No intrahepatic biliary ductal  dilatation. The gallbladder is distended and contains slightly high attenuating content, likely hyper concentrated bile or sludge. There is diffuse haziness of the wall of the gallbladder and pericholecystic fat concerning for acute cholecystitis. Further evaluation with ultrasound recommended. Pancreas: Unremarkable. No pancreatic ductal dilatation or surrounding inflammatory changes. Spleen: Normal in size without focal abnormality. Adrenals/Urinary Tract: The adrenal glands are unremarkable. There is no hydronephrosis or nephrolithiasis on either side. There has been interval removal of the previously seen percutaneous nephrostomy tubes and staghorn calculi. A 2 cm exophytic hypodense lesion from the posterior interpolar left kidney is not characterized but appears similar to prior CT. The visualized ureters appear unremarkable. The urinary bladder is decompressed around a Foley catheter. Stomach/Bowel: There is large amount of dense stool throughout the colon and in the rectum. The sigmoid colon is redundant. There is air distention of the transverse colon measuring up to 8.5 cm in diameter. No evidence of mechanical obstruction. The small bowel is unremarkable. No evidence of acute appendicitis. Vascular/Lymphatic: The abdominal aorta and IVC are grossly unremarkable on this noncontrast CT. No portal venous gas. There is no adenopathy. Reproductive: The prostate and seminal vesicles are are grossly unremarkable. No pelvic mass. Other: None Musculoskeletal: Osteopenia with degenerative changes of the spine. Multilevel compression fracture primarily at L3 and L5 similar to prior CT. There is vertebroplasty changes at L3. No acute fracture. IMPRESSION: 1. Distended gallbladder with findings concerning for acute cholecystitis. Further evaluation with ultrasound recommended. 2. Severe constipation with possible fecal impaction within the rectum. No bowel obstruction. 3. Interval removal of  the previously seen  percutaneous nephrostomy tubes and staghorn calculi. No hydronephrosis or nephrolithiasis. Electronically Signed   By: Anner Crete M.D.   On: 10/15/2018 00:06   US Abdomen Limited Ruq  Result Date: 10/15/2018 CLINICAL DATA:  Elevated LFTs EXAM: ULTRASOUND ABDOMEN LIMITED RIGHT UPPER QUADRANT COMPARISON:  CT from the previous day. FINDINGS: Gallbladder: Well distended with mild gallbladder wall thickening and pericholecystic fluid. Negative sonographic Murphy's sign is noted. No cholelithiasis is seen. Common bile duct: Diameter: 3.9 mm. Liver: Diffuse heterogeneity is noted with mild nodularity consistent with cirrhotic change. Portal vein is patent on color Doppler imaging with normal direction of blood flow towards the liver. IMPRESSION: Distended gallbladder with wall thickening although a negative sonographic Murphy sign is elicited. This could still represent cholecystitis in the appropriate clinical setting. Heterogeneity in the liver likely representing underlying hepatocellular disease. This could account for the poor uptake of radiotracer on the prior exam. No biliary ductal dilatation is noted. Electronically Signed   By: Inez Catalina M.D.   On: 10/15/2018 17:02    Labs: BMET Recent Labs  Lab 10/11/2018 1957 09/29/2018 2225 10/15/18 0723 10/16/18 0419  NA 130*  --  131* 135  K 3.7  --  4.0 3.6  CL 90*  --  95* 92*  CO2 20*  --  15* 21*  GLUCOSE 224*  --  168* 145*  BUN 114*  --  121* 131*  CREATININE 6.12*  --  6.10* 6.45*  CALCIUM 7.3*  --  6.9* 6.4*  PHOS  --  7.1*  --  9.7*   CBC Recent Labs  Lab 10/01/2018 1957 10/15/18 0723 10/15/18 1358 10/16/18 0419  WBC 15.9* 14.8*  --  16.9*  NEUTROABS 14.5* 13.5*  --   --   HGB 9.7* 9.2*  --  9.3*  HCT 30.4* 28.1*  --  27.6*  MCV 86.9 85.2  --  83.6  PLT 233 210 212 235    Medications:    . budesonide (PULMICORT) nebulizer solution  0.25 mg Nebulization BID  . insulin aspart  0-9 Units Subcutaneous Q4H  .  ipratropium-albuterol  3 mL Nebulization TID   Elmarie Shiley, MD 10/16/2018, 11:07 AM

## 2018-10-16 NOTE — Consult Note (Addendum)
[facility] [place]  Dr. Collene Mares and Dr. Benson [place]   HPI Patient seen and examined with Dr. Lars Ho. Complicated case with positive Hepatitis C antibody and marked elevated LFT's. An abdominal ultrasound done after admission revealed a distended gallbladder with heterogeneity in the liver indicating underlying liver disease. As per my discission with Dr. Letta Median a urine toxicology screen was ordered. An MRI revealed sludge in the gallbladder with mild diffuse thickening of the gallbladder wall and mild pericholecystic edema suspicious for acute cholecystitis. Marked distention of the transverse colon indicating constipation. Patient is unable to give [place] any details on his symptoms or his history. On exam he seems tender around the RUQ. We will need to monitor his LFT's closely for now. His is certainly a very confusing presentation.    Javier Ho is a 63 y.o m with copd, ckd4, dm 2, diastolic heart failure who presented with weakness and multiple falls over the past week. History is limited due to patient's confusion. He states that he has had right sided abdominal pain for the past week. He has been nauseaous, but has not vomited. He has not noted any change in color of his stool.   On admission, the patient was found to have worsening renal function, transaminitis, and elevated t bili. CT renal stone found distended gallbladder (concerning for acute cholecystitis) and severe constipation.  ROS  Review of Systems  Constitutional: Negative for fever.  Gastrointestinal: Positive for abdominal pain and nausea. Negative for vomiting.    Vitals:   10/16/18 0725 10/16/18 0755  BP:  132/69  Pulse:  91  Resp:  (!) 23  Temp:  97.6 F (36.4 C)  SpO2: 96% 98%    Physical Exam  Constitutional: He appears well-developed and well-nourished.  HENT:  Head: Normocephalic and atraumatic.  Eyes: Conjunctivae are normal.  Cardiovascular: Normal rate, regular rhythm and normal heart sounds.  No murmur  heard. Respiratory: Effort normal. No respiratory distress. He has wheezes.  GI: Soft. Bowel sounds are normal. He exhibits distension and mass (umbilical region). There is abdominal tenderness (right lower quadrant).  Musculoskeletal:        General: No deformity or edema.  Neurological: He is alert.  Psychiatric: He has a normal mood and affect. His behavior is normal. Judgment and thought content normal.   Assessment and plan   Javier Ho is a 63 y.o m with copd, ckd4, dm2, diastolic heart failure presenting with marked transaminitis, hyperbilirubinemia, and imaging suggestive of cholecystitis.   Acute Cholecystitis  Patient with transaminitis (ast:alt=[id number]), t bilirubin=7.2, alp=1331. HIDA scan was not able to visualize biliary tree and gallbladder at 2 hrs suggesting biliary obstruction. RUQ ultrasound showed distended gallbladder wall thickening without biliary ductal dilation and underlying hepatocellular disease.  Hepatitis panel shows immunity to hep A and b, with HCV Ab positive at 5.5. Will order hep c rna. Surgery will not do percut cholecystostomy. Patient may benefit from ERCP, will discuss with Dr. Collene Mares.  -continue zosyn -hepatitis c rna quant pending   AoC kidney disease stage IV Patient with worsening kidney function. Creatinine elevation to 6.45 from baseline of 3 and anion gap metabolic acidosis.  The patient is being followed by nephrology who are doing fluid management with isotonic nahco3 and will plan    -avoid nephrotoxic agents -nephrology to follow   COPD  -continue pulmicort, duoneb,    Javier Mage, MD Internal Medicine PGY2 Pager:[phone] 10/16/2018, 9:19 AM

## 2018-10-16 NOTE — Progress Notes (Signed)
Limited MRI obtained, pt was in scanner moving and twisting and would not hold still. Pt was confused at time of exam. Some images were sent to radiologist to review and read. Pt was medicated for this attempt.  Pt also had medication on his prior attempt earlier today.

## 2018-10-17 ENCOUNTER — Inpatient Hospital Stay (HOSPITAL_COMMUNITY): Payer: Medicare HMO

## 2018-10-17 LAB — COMPREHENSIVE METABOLIC PANEL
ALT: 741 U/L — ABNORMAL HIGH (ref 0–44)
AST: 2623 U/L — ABNORMAL HIGH (ref 15–41)
Albumin: 2.1 g/dL — ABNORMAL LOW (ref 3.5–5.0)
Alkaline Phosphatase: 1390 U/L — ABNORMAL HIGH (ref 38–126)
Anion gap: 26 — ABNORMAL HIGH (ref 5–15)
BUN: 141 mg/dL — ABNORMAL HIGH (ref 8–23)
CALCIUM: 5.8 mg/dL — AB (ref 8.9–10.3)
CHLORIDE: 87 mmol/L — AB (ref 98–111)
CO2: 24 mmol/L (ref 22–32)
Creatinine, Ser: 6.74 mg/dL — ABNORMAL HIGH (ref 0.61–1.24)
GFR calc Af Amer: 9 mL/min — ABNORMAL LOW (ref >60)
GFR calc non Af Amer: 8 mL/min — ABNORMAL LOW (ref >60)
Glucose, Bld: 168 mg/dL — ABNORMAL HIGH (ref 70–99)
Potassium: 3.5 mmol/L (ref 3.5–5.1)
Sodium: 137 mmol/L (ref 135–145)
Total Bilirubin: 8.6 mg/dL — ABNORMAL HIGH (ref 0.3–1.2)
Total Protein: 6.5 g/dL (ref 6.5–8.1)

## 2018-10-17 LAB — GLUCOSE, CAPILLARY
Glucose-Capillary: 173 mg/dL — ABNORMAL HIGH (ref 70–99)
Glucose-Capillary: 134 mg/dL — ABNORMAL HIGH (ref 70–99)
Glucose-Capillary: 150 mg/dL — ABNORMAL HIGH (ref 70–99)
Glucose-Capillary: 142 mg/dL — ABNORMAL HIGH (ref 70–99)
Glucose-Capillary: 176 mg/dL — ABNORMAL HIGH (ref 70–99)
Glucose-Capillary: 131 mg/dL — ABNORMAL HIGH (ref 70–99)

## 2018-10-17 LAB — RAPID URINE DRUG SCREEN, HOSP PERFORMED
Amphetamines: NOT DETECTED
Barbiturates: NOT DETECTED
Benzodiazepines: NOT DETECTED
Cocaine: NOT DETECTED
Opiates: NOT DETECTED
Tetrahydrocannabinol: NOT DETECTED

## 2018-10-17 LAB — CBC
HCT: 26.6 % — ABNORMAL LOW (ref 39.0–52.0)
Hemoglobin: 9 g/dL — ABNORMAL LOW (ref 13.0–17.0)
MCH: 27.5 pg (ref 26.0–34.0)
MCHC: 33.8 g/dL (ref 30.0–36.0)
MCV: 81.3 fL (ref 80.0–100.0)
Platelets: 250 10*3/uL (ref 150–400)
RBC: 3.27 MIL/uL — ABNORMAL LOW (ref 4.22–5.81)
RDW: 14.6 % (ref 11.5–15.5)
WBC: 18.5 10*3/uL — ABNORMAL HIGH (ref 4.0–10.5)
nRBC: 0 % (ref 0.0–0.2)

## 2018-10-17 LAB — BLOOD GAS, ARTERIAL
Acid-Base Excess: 5.9 mmol/L — ABNORMAL HIGH (ref 0.0–2.0)
BICARBONATE: 29.9 mmol/L — AB (ref 20.0–28.0)
Drawn by: 301361
FIO2: 21
O2 Saturation: 95.2 %
Patient temperature: 97.4
pCO2 arterial: 41.4 mmHg (ref 32.0–48.0)
pH, Arterial: 7.468 — ABNORMAL HIGH (ref 7.350–7.450)
pO2, Arterial: 76.6 mmHg — ABNORMAL LOW (ref 83.0–108.0)

## 2018-10-17 LAB — PROTIME-INR
INR: 1.56
Prothrombin Time: 18.5 seconds — ABNORMAL HIGH (ref 11.4–15.2)

## 2018-10-17 LAB — GAMMA GT: GGT: 142 U/L — ABNORMAL HIGH (ref 7–50)

## 2018-10-17 LAB — HCV RNA QUANT: HCV Quantitative: NOT DETECTED IU/mL (ref >50)

## 2018-10-17 LAB — ALT: ALT: 488 U/L — ABNORMAL HIGH (ref 0–44)

## 2018-10-17 MED ORDER — CALCIUM GLUCONATE-NACL 2-0.675 GM/100ML-% IV SOLN
2.0000 g | Freq: Once | INTRAVENOUS | Status: AC
  Administered 2018-10-17: 2000 mg via INTRAVENOUS
  Filled 2018-10-17: qty 100

## 2018-10-17 MED ORDER — CHLORHEXIDINE GLUCONATE CLOTH 2 % EX PADS
6.0000 | MEDICATED_PAD | Freq: Every day | CUTANEOUS | Status: DC
  Administered 2018-10-19 – 2018-10-22 (×4): 6 via TOPICAL

## 2018-10-17 MED ORDER — PIPERACILLIN-TAZOBACTAM 3.375 G IVPB
3.3750 g | Freq: Two times a day (BID) | INTRAVENOUS | Status: DC
  Administered 2018-10-17 – 2018-10-18 (×3): 3.375 g via INTRAVENOUS
  Filled 2018-10-17 (×4): qty 50

## 2018-10-17 NOTE — Progress Notes (Signed)
PROGRESS NOTE  Javier Ho. OVF:[id number] DOB: 25-Sep-1956 DOA: 10/07/2018 PCP: Deloria Lair., MD   LOS: 3 days   Brief Narrative / Interim history: 63 y.o.malewithhistory of COPD, diastolic CHF, chronic kidney disease stage IV, chronic anemia, diabetes mellitus type 2, anxiety and depression admitted with weakness and multiple falls the last 1 week. On admission, found to have markedly elevated liver enzymes, worsening renal function with uremia and nonspecific EKG changes with multiple PVCs.  CT head without acute intracranial finding.  Portable chest x-ray without acute cardiopulmonary finding.  CT renal stone revealed distended gallbladder and severe constipation possible fecal impaction.  HIDA positive.  Patient could not tolerate MRCP. General surgery, GI and nephrology consulted.  Subjective: -More encephalopathic overnight and this morning, has mittens on.  No meaningful interaction as he is moving his arms around but does not really answer my questions  Assessment & Plan: Active Problems:   COPD (chronic obstructive pulmonary disease) (HCC)   Chronic use of opiate for therapeutic purpose   Diabetes mellitus with nephropathy ([place])   ARF (acute renal failure) (HCC)   Urinary retention   Lower urinary tract infectious disease   Hepatitis   Frequent falls   Bronchitis   Acute renal failure superimposed on chronic kidney disease (HCC)   Principal Problem Markedly elevated liver enzymes/hyperbilirubinemia/alkaline phosphatase: concern for cholecystitis/biliary obstruction  -General surgery consulted, discussed with Brigid Re, PA, he does not appear that gallbladder is an issue even with positive HIDA scan, suspect abnormal HIDA scan because of hepatocellular issues -LFTs continue to get worse, GI was consulted, discussed with Dr. Benson Norway this morning and appreciate input.  Get a Doppler ultrasound of the liver -Discussed with patient's friend over the phone who lives  with the patient, no reports of abusing Percocet or any other toxins/drugs.  Tylenol level was negative on admission -MRI again with concern for cholecystitis, have asked general surgery to look over imaging but less likely to cause this degree of LFT elevation  Active Problems AKI on CKD/uremia/hyperphosphatemia  -CT renal stone study did not reveal any obstructive etiology.  Only on low-dose aspirin and Lasix at home. -Neurology following -Renal function continues to get worse  Acute toxic metabolic encephalopathy -Mental status slightly worse, likely multifactorial in the setting of liver as well as renal failure -Continue stepdown monitoring: Low threshold to transfer to ICU.  I will obtain ABG  Anion gap metabolic acidosis -Likely due to uremia and renal failure as above -Manage as above  Chronic diastolic CHF -EF 55 to 84% with G1 DD.  No signs of fluid overload on exam.  No respiratory distress. -Monitor, no significant evidence of fluid overload  Multiple falls -Could be due to the above.  CT head without acute intracranial finding.  DM2  -Last A1c 7.9% in 2018.  On 50 units of Lantus at home. -Continue sliding scale, CBGs controlled, now n.p.o. due to encephalopathy  COPD -No wheezing, stable -Continue breathing treatments.  Multiple PVCs -Likely due to electrolyte issues with renal failure.  Goals of care -Patient quite confused, tried calling all contact numbers was finally able to get in touch with his friend who is aware of patient's hospitalization as she was the one that brought him to the hospital however patient does not have a POA or any family around.  Discussion with friend over the phone is quite difficult as she with repeatedly fall asleep during our conversation  Diarrhea -Slightly worse overnight, will monitor, low threshold to rule  out C. difficile given antibiotics and elevated white count, he did have an enema 2 days ago for constipation.  He has  been on chronic narcotics and may be having narcotic withdrawal.  Will await on ABG and monitor mental status narcotics can further depressed his respiratory drive   Scheduled Meds: . budesonide (PULMICORT) nebulizer solution  0.25 mg Nebulization BID  . insulin aspart  0-9 Units Subcutaneous Q4H  . ipratropium-albuterol  3 mL Nebulization BID  . LORazepam  1 mg Intravenous Once   Continuous Infusions: . piperacillin-tazobactam (ZOSYN)  IV Stopped (10/17/18 8546)  .  sodium bicarbonate (isotonic) infusion in sterile water Stopped (10/17/18 0648)   PRN Meds:.hydrALAZINE, ipratropium-albuterol, ondansetron **OR** ondansetron (ZOFRAN) IV  DVT prophylaxis: SCDs Code Status: Full code Family Communication: no family at bedside  Disposition Plan: TBD  Consultants:   GI  Surgery  Nephrology   Procedures:   2D echo Impressions: - Technically difficult study. LVEF 65-70%, normal wall thickness, normal wall motion, grade 1 DD, indeterminate LV filling pressure, normal LA size, mild RAE  Antimicrobials:  Zosyn 1/20 >>   Objective: Vitals:   10/16/18 2130 10/16/18 2339 10/17/18 0445 10/17/18 0728  BP:  134/82 (!) 108/96 102/71  Pulse:  91 91 92  Resp:   (!) 25 (!) 26  Temp:  98 F (36.7 C) 97.9 F (36.6 C) (!) 97.4 F (36.3 C)  TempSrc:  Axillary Axillary Axillary  SpO2: 95% 93% 96% 96%  Weight:   105.6 kg   Height:        Intake/Output Summary (Last 24 hours) at 10/17/2018 0925 Last data filed at 10/17/2018 0914 Gross per 24 hour  Intake 2898.75 ml  Output 1150 ml  Net 1748.75 ml   Filed Weights   10/15/18 0158 10/16/18 0404 10/17/18 0445  Weight: 105.9 kg 106.9 kg 105.6 kg    Examination:  Constitutional: Slightly agitated, keeps his eyes closed Eyes: Would not open his eyes ENMT: Moist mucous membranes Neck: normal, supple Respiratory: Clear to auscultation anterior lung fields without wheezing.  Shallow breathing Cardiovascular: Regular rate and rhythm,  no significant peripheral edema Abdomen: distended but not appearing tender to palpation Musculoskeletal: no clubbing / cyanosis.  Skin: No rashes appreciated Neurologic: Moves all 4 extremities, no apparent focal deficits, does not follow commands  Data Reviewed: I have independently reviewed following labs and imaging studies   CBC: Recent Labs  Lab 10/17/2018 1957 10/15/18 0723 10/15/18 1358 10/16/18 0419 10/17/18 0408  WBC 15.9* 14.8*  --  16.9* 18.5*  NEUTROABS 14.5* 13.5*  --   --   --   HGB 9.7* 9.2*  --  9.3* 9.0*  HCT 30.4* 28.1*  --  27.6* 26.6*  MCV 86.9 85.2  --  83.6 81.3  PLT 233 210 212 235 270   Basic Metabolic Panel: Recent Labs  Lab 10/12/2018 1957 10/21/2018 2225 10/15/18 0723 10/16/18 0419 10/17/18 0408  NA 130*  --  131* 135 137  K 3.7  --  4.0 3.6 3.5  CL 90*  --  95* 92* 87*  CO2 20*  --  15* 21* 24  GLUCOSE 224*  --  168* 145* 168*  BUN 114*  --  121* 131* 141*  CREATININE 6.12*  --  6.10* 6.45* 6.74*  CALCIUM 7.3*  --  6.9* 6.4* 5.8*  MG  --  2.6*  --   --   --   PHOS  --  7.1*  --  9.7*  --  GFR: Estimated Creatinine Clearance: 14.1 mL/min (A) (by C-G formula based on SCr of 6.74 mg/dL (H)). Liver Function Tests: Recent Labs  Lab 10/21/2018 1957 10/15/18 0723 10/16/18 0419 10/17/18 0408  AST 1,474* 1,475* 2,002* 2,623*  ALT 800* 737* 854* 741*  ALKPHOS 911* 890* 1,331* 1,390*  BILITOT 5.8* 5.6* 7.2* 8.6*  PROT 7.5 6.4* 6.9 6.5  ALBUMIN 2.8* 2.3* 2.3* 2.1*   No results for input(s): LIPASE, AMYLASE in the last 168 hours. Recent Labs  Lab 10/11/2018 2225  AMMONIA 23   Coagulation Profile: Recent Labs  Lab 10/19/2018 1957 10/15/18 1358  INR 1.29 1.34   Cardiac Enzymes: Recent Labs  Lab 10/08/2018 2225 10/15/18 0723 10/15/18 1705 10/15/18 1933  CKTOTAL 56 58  --   --   CKMB  --  1.9  --   --   TROPONINI  --  0.06* 0.05* 0.04*   BNP (last 3 results) No results for input(s): PROBNP in the last 8760 hours. HbA1C: Recent Labs      10/15/18 1358  HGBA1C 7.7*   CBG: Recent Labs  Lab 10/16/18 1606 10/16/18 2003 10/16/18 2336 10/17/18 0420 10/17/18 0730  GLUCAP 108* 129* 152* 173* 134*   Lipid Profile: No results for input(s): CHOL, HDL, LDLCALC, TRIG, CHOLHDL, LDLDIRECT in the last 72 hours. Thyroid Function Tests: Recent Labs    10/13/2018 2225  TSH 0.268*   Anemia Panel: Recent Labs    10/15/18 1933  FERRITIN 984*  TIBC 216*  IRON 36*   Urine analysis:    Component Value Date/Time   COLORURINE AMBER (A) 10/20/2018 2250   APPEARANCEUR HAZY (A) 10/20/2018 2250   LABSPEC 1.011 10/16/2018 2250   PHURINE 5.0 09/30/2018 2250   GLUCOSEU NEGATIVE 10/13/2018 2250   HGBUR SMALL (A) 09/29/2018 2250   BILIRUBINUR NEGATIVE 10/06/2018 2250   [place] 09/28/2018 2250   PROTEINUR 30 (A) 10/09/2018 2250   NITRITE NEGATIVE 10/15/2018 2250   LEUKOCYTESUR NEGATIVE 09/27/2018 2250   Sepsis Labs: Invalid input(s): PROCALCITONIN, LACTICIDVEN  Recent Results (from the past 240 hour(s))  Culture, blood (routine x 2)     Status: None (Preliminary result)   Collection Time: 10/15/2018 10:26 PM  Result Value Ref Range Status   Specimen Description BLOOD SITE NOT SPECIFIED  Final   Special Requests   Final    BOTTLES DRAWN AEROBIC AND ANAEROBIC Blood Culture adequate volume   Culture   Final    NO GROWTH 1 DAY Performed at [facility], [address] [place], [place] [address]    Report Status PENDING  Incomplete  Urine culture     Status: Abnormal   Collection Time: 10/01/2018 11:34 PM  Result Value Ref Range Status   Specimen Description URINE, CATHETERIZED  Final   Special Requests   Final    NONE Performed at [facility], [place] [address], [place], [place] [address]    Culture >=100,000 COLONIES/mL YEAST (A)  Final   Report Status 10/16/2018 FINAL  Final  Culture, blood (routine x 2)     Status: None (Preliminary result)   Collection Time: 10/15/18 12:24 AM  Result Value Ref  Range Status   Specimen Description BLOOD LEFT HAND  Final   Special Requests   Final    BOTTLES DRAWN AEROBIC AND ANAEROBIC Blood Culture results may not be optimal due to an inadequate volume of blood received in culture bottles   Culture   Final    NO GROWTH 1 DAY Performed at  [facility], [place] [address] [place], [place] [address]    Report Status PENDING  Incomplete      Radiology Studies: Nm Hepatobiliary Liver Func  Result Date: 10/15/2018 CLINICAL DATA:  Abdominal pain.  Abnormal CT scan. EXAM: NUCLEAR MEDICINE HEPATOBILIARY IMAGING TECHNIQUE: Sequential images of the abdomen were obtained out to 60 minutes following intravenous administration of radiopharmaceutical. RADIOPHARMACEUTICALS:  5.4 mCi Tc-73m Choletec IV COMPARISON:  CT scan 001/03/202019 FINDINGS: Fairly prompt but somewhat heterogeneous uptake in the liver. The biliary tree is not visualized for certain and the gallbladder is never visualized at 2 hours. I do not see any obvious activity in the bowel. IMPRESSION: Findings suggest biliary obstruction with non visualization of the biliary tree and gallbladder at 2 hours. Electronically Signed   By: PMarijo SanesM.D.   On: 10/15/2018 10:23   Dg Pelvis Portable  Result Date: 10/15/2018 CLINICAL DATA:  Recent fall with pelvic pain, initial encounter EXAM: PORTABLE PELVIS 1-2 VIEWS COMPARISON:  CT from the previous day. FINDINGS: A portion of the pelvis is obscured by overlying bowel content. No acute fracture or dislocation is noted. Degenerative change of the hip joints and lumbar spine are seen. Scattered distended colon with fecal material within is again identified and stable. IMPRESSION: No acute fracture noted. Electronically Signed   By: MInez CatalinaM.D.   On: 10/15/2018 10:18   Mr Abdomen Mrcp Wo Contrast  Result Date: 10/16/2018 CLINICAL DATA:  Elevated liver function tests. Abdominal distention. EXAM: MRI ABDOMEN WITHOUT CONTRAST  (INCLUDING MRCP) TECHNIQUE:  Multiplanar multisequence MR imaging of the abdomen was performed. Heavily T2-weighted images of the biliary and pancreatic ducts were obtained, and three-dimensional MRCP images were rendered by post processing. COMPARISON:  Noncontrast CT on 10/22/2018 FINDINGS: Image degradation by motion artifact noted. Lower chest: Bibasilar atelectasis. Stable elevation of left hemidiaphragm. Hepatobiliary: No masses visualized on this unenhanced exam. The gallbladder is markedly distended and contains layering sludge. There is mild diffuse gallbladder wall thickening and mild pericholecystic edema suspicious for acute cholecystitis. No evidence of biliary ductal dilatation. Pancreas: No mass or inflammatory process visualized on this unenhanced exam. Spleen:  Within normal limits in size. Adrenals/Urinary tract: Left renal cyst again noted. No evidence of masses or hydronephrosis. Stomach/Bowel: Marked dilatation of the nondependent transverse colon again seen, which may be due to constipation or ileus. Vascular/Lymphatic: No pathologically enlarged lymph nodes identified. No evidence of abdominal aortic aneurysm. Other:  None. Musculoskeletal:  No suspicious bone lesions identified. IMPRESSION: Markedly distended gallbladder with mild wall thickening and pericholecystic edema, highly suspicious for cholecystitis. No evidence of biliary obstruction. Stable colonic distension, which may be due to constipation or ileus. Bibasilar atelectasis. Electronically Signed   By: JEarle GellM.D.   On: 10/16/2018 21:13   UKoreaAbdomen Limited Ruq  Result Date: 10/15/2018 CLINICAL DATA:  Elevated LFTs EXAM: ULTRASOUND ABDOMEN LIMITED RIGHT UPPER QUADRANT COMPARISON:  CT from the previous day. FINDINGS: Gallbladder: Well distended with mild gallbladder wall thickening and pericholecystic fluid. Negative sonographic Murphy's sign is noted. No cholelithiasis is seen. Common bile duct: Diameter: 3.9 mm. Liver: Diffuse heterogeneity is noted  with mild nodularity consistent with cirrhotic change. Portal vein is patent on color Doppler imaging with normal direction of blood flow towards the liver. IMPRESSION: Distended gallbladder with wall thickening although a negative sonographic Murphy sign is elicited. This could still represent cholecystitis in the appropriate clinical setting. Heterogeneity in the liver likely representing underlying hepatocellular disease. This could account for  the poor uptake of radiotracer on the prior exam. No biliary ductal dilatation is noted. Electronically Signed   By: Inez Catalina M.D.   On: 10/15/2018 17:02   Time spent: 35 minutes, more than 50% at bedside and involved in evaluation x2, friend discussion over the phone  Marzetta Board, MD, PhD [facility]  Contact via  [email or web address]  [place] P: [phone]  F: [phone]

## 2018-10-17 NOTE — Progress Notes (Signed)
RT has attempted to get ABG on patient twice this morning. The first time he was getting a bath and the second time he was gone for an ultrasound. RT will try back at a later time.

## 2018-10-17 NOTE — Care Management Important Message (Signed)
Important Message  Patient Details  Name: Javier Ho. MRN: [id number] Date of Birth: Oct 09, 1955   Medicare Important Message Given:  Yes    Dian Laprade P Alric Geise 10/17/2018, 11:19 AM

## 2018-10-17 NOTE — Progress Notes (Signed)
Patient ID: Javier Craw., male   DOB: Nov 07, 1955, 63 y.o.   MRN: [id number] [facility] Progress Note   Assessment/ Plan:   1.  Acute kidney injury on chronic kidney disease stage IV:  Renal function continues to worsen with fair urine output.  Although he has many reasons for his encephalopathy, the clearly modifiable factor here may be his renal insufficiency for which a trial of dialysis may be warranted at this point.  Unfortunately, the underlying process with his rising transaminases does not appear to have a clear-cut cause and this has a large bearing on his overall prognosis/survival. 2.  Anion gap metabolic acidosis: Secondary to acute kidney injury/starvation ketosis-currently corrected with sodium bicarbonate drip. 3.  Encephalopathy: Likely metabolic encephalopathy with multiple incriminating etiologies including acute liver injury/SIRS and possibly uremia.  Will initiate dialysis. 4.    Awaited LFTs/bilirubin: With positive HIDA scan in the background of suggestive lab and clinical findings including markedly elevated transaminases, hyperbilirubinemia and abdominal tenderness.  On antimicrobial coverage with Zosyn, felt not to be a surgical candidate and ongoing work-up per gastroenterology. 5.  History of diastolic heart failure: Appears to be compensated-we will monitor with dialysis. 6.  Anemia: Likely anemia of chronic kidney disease versus critical illness based on current presentation.    With low iron saturation however, avoiding iron at this time with acute liver injury.   Subjective:   Altered mentation overnight prompting placement of protective mittens.   Objective:   BP 102/71 (BP Location: Right Arm)   Pulse 92   Temp (!) 97.4 F (36.3 C) (Axillary)   Resp (!) 26   Ht 6' 1"  (1.854 m)   Wt 105.6 kg   SpO2 96%   BMI 30.71 kg/m   Intake/Output Summary (Last 24 hours) at 10/17/2018 1122 Last data filed at 10/17/2018 0914 Gross per 24 hour   Intake 2538.86 ml  Output 1150 ml  Net 1388.86 ml   Weight change: -1.3 kg  Physical Exam: Gen: Appears to be uncomfortable in bed, shifting himself constantly and soiled himself. CVS: Pulse regular rhythm, normal rate, S1 and S2 normal Resp: Anteriorly clear to auscultation, no rales/rhonchi Abd: Soft, obese, nontender Ext: No lower extremity edema  Imaging: Mr Abdomen Mrcp Wo Contrast  Result Date: 10/16/2018 CLINICAL DATA:  Elevated liver function tests. Abdominal distention. EXAM: MRI ABDOMEN WITHOUT CONTRAST  (INCLUDING MRCP) TECHNIQUE: Multiplanar multisequence MR imaging of the abdomen was performed. Heavily T2-weighted images of the biliary and pancreatic ducts were obtained, and three-dimensional MRCP images were rendered by post processing. COMPARISON:  Noncontrast CT on 10/09/2018 FINDINGS: Image degradation by motion artifact noted. Lower chest: Bibasilar atelectasis. Stable elevation of left hemidiaphragm. Hepatobiliary: No masses visualized on this unenhanced exam. The gallbladder is markedly distended and contains layering sludge. There is mild diffuse gallbladder wall thickening and mild pericholecystic edema suspicious for acute cholecystitis. No evidence of biliary ductal dilatation. Pancreas: No mass or inflammatory process visualized on this unenhanced exam. Spleen:  Within normal limits in size. Adrenals/Urinary tract: Left renal cyst again noted. No evidence of masses or hydronephrosis. Stomach/Bowel: Marked dilatation of the nondependent transverse colon again seen, which may be due to constipation or ileus. Vascular/Lymphatic: No pathologically enlarged lymph nodes identified. No evidence of abdominal aortic aneurysm. Other:  None. Musculoskeletal:  No suspicious bone lesions identified. IMPRESSION: Markedly distended gallbladder with mild wall thickening and pericholecystic edema, highly suspicious for cholecystitis. No evidence of biliary obstruction. Stable colonic  distension, which may be due  to constipation or ileus. Bibasilar atelectasis. Electronically Signed   By: Earle Gell M.D.   On: 10/16/2018 21:13   US Liver Doppler  Result Date: 10/17/2018 CLINICAL DATA:  Elevated liver function tests EXAM: DUPLEX ULTRASOUND OF LIVER TECHNIQUE: Color and duplex Doppler ultrasound was performed to evaluate the hepatic in-flow and out-flow vessels. COMPARISON:  None. FINDINGS: Portal Vein Velocities Main:  65 cm/sec Right:  43 cm/sec Left:  36 cm/sec Hepatic Vein Velocities Right:  65 cm/sec Middle:  64 cm/sec Left:  64 cm/sec Hepatic Artery Velocity:  90 cm/sec Splenic Vein Velocity:  Not visualized Varices: Absent Ascites: Trace ascites is present. The liver is diffusely heterogeneous with a nodular contour. This is compatible with cirrhotic change throughout the liver. Hepatic veins are patent with hepatofugal flow. The portal vein and its branches are hepatopetal inflow. The spleen is within normal limits in size. The splenic vein could not be clearly identified it has patency can not be confirmed by this study. IMPRESSION: Hepatic and portal veins are patent with normal directionality of flow. The splenic vein was not visualized. It has patency can not be confirmed by this study. Cirrhotic liver. Trace ascites. Electronically Signed   By: Marybelle Killings M.D.   On: 10/17/2018 09:34   US Abdomen Limited Ruq  Result Date: 10/15/2018 CLINICAL DATA:  Elevated LFTs EXAM: ULTRASOUND ABDOMEN LIMITED RIGHT UPPER QUADRANT COMPARISON:  CT from the previous day. FINDINGS: Gallbladder: Well distended with mild gallbladder wall thickening and pericholecystic fluid. Negative sonographic Murphy's sign is noted. No cholelithiasis is seen. Common bile duct: Diameter: 3.9 mm. Liver: Diffuse heterogeneity is noted with mild nodularity consistent with cirrhotic change. Portal vein is patent on color Doppler imaging with normal direction of blood flow towards the liver. IMPRESSION: Distended  gallbladder with wall thickening although a negative sonographic Murphy sign is elicited. This could still represent cholecystitis in the appropriate clinical setting. Heterogeneity in the liver likely representing underlying hepatocellular disease. This could account for the poor uptake of radiotracer on the prior exam. No biliary ductal dilatation is noted. Electronically Signed   By: Inez Catalina M.D.   On: 10/15/2018 17:02    Labs: BMET Recent Labs  Lab 10/20/2018 1957 10/07/2018 2225 10/15/18 0723 10/16/18 0419 10/17/18 0408  NA 130*  --  131* 135 137  K 3.7  --  4.0 3.6 3.5  CL 90*  --  95* 92* 87*  CO2 20*  --  15* 21* 24  GLUCOSE 224*  --  168* 145* 168*  BUN 114*  --  121* 131* 141*  CREATININE 6.12*  --  6.10* 6.45* 6.74*  CALCIUM 7.3*  --  6.9* 6.4* 5.8*  PHOS  --  7.1*  --  9.7*  --    CBC Recent Labs  Lab 10/08/2018 1957 10/15/18 0723 10/15/18 1358 10/16/18 0419 10/17/18 0408  WBC 15.9* 14.8*  --  16.9* 18.5*  NEUTROABS 14.5* 13.5*  --   --   --   HGB 9.7* 9.2*  --  9.3* 9.0*  HCT 30.4* 28.1*  --  27.6* 26.6*  MCV 86.9 85.2  --  83.6 81.3  PLT 233 210 212 235 250    Medications:    . budesonide (PULMICORT) nebulizer solution  0.25 mg Nebulization BID  . insulin aspart  0-9 Units Subcutaneous Q4H  . ipratropium-albuterol  3 mL Nebulization BID  . LORazepam  1 mg Intravenous Once   Elmarie Shiley, MD 10/17/2018, 11:22 AM

## 2018-10-17 NOTE — Progress Notes (Signed)
Calcium 5.8 paged Chaney Malling with information

## 2018-10-17 NOTE — Progress Notes (Signed)
Subjective: No acute events.  Objective: Vital signs in last 24 hours: Temp:  [97.5 F (36.4 C)-98 F (36.7 C)] 97.9 F (36.6 C) (01/23 0445) Pulse Rate:  [88-91] 91 (01/23 0445) Resp:  [21-25] 25 (01/23 0445) BP: (108-134)/(58-96) 108/96 (01/23 0445) SpO2:  [93 %-98 %] 96 % (01/23 0445) Weight:  [105.6 kg] 105.6 kg (01/23 0445) Last BM Date: 10/16/18  Intake/Output from previous day: 01/22 0701 - 01/23 0700 In: 2073.3 [I.V.:1985; IV Piggyback:88.3] Out: 625 [Urine:625] Intake/Output this shift: Total I/O In: 664.7 [I.V.:626.3; IV Piggyback:38.3] Out: 625 [Urine:625]  General appearance: confused GI: soft, non-tender; bowel sounds normal; no masses,  no organomegaly  Lab Results: Recent Labs    10/15/18 0723 10/15/18 1358 10/16/18 0419 10/17/18 0408  WBC 14.8*  --  16.9* 18.5*  HGB 9.2*  --  9.3* 9.0*  HCT 28.1*  --  27.6* 26.6*  PLT 210 212 235 250   BMET Recent Labs    10/15/18 0723 10/16/18 0419 10/17/18 0408  NA 131* 135 137  K 4.0 3.6 3.5  CL 95* 92* 87*  CO2 15* 21* 24  GLUCOSE 168* 145* 168*  BUN 121* 131* 141*  CREATININE 6.10* 6.45* 6.74*  CALCIUM 6.9* 6.4* 5.8*   LFT Recent Labs    10/15/18 0723  10/17/18 0408  PROT 6.4*   < > 6.5  ALBUMIN 2.3*   < > 2.1*  AST 1,475*   < > 2,623*  ALT 737*   < > 741*  ALKPHOS 890*   < > 1,390*  BILITOT 5.6*   < > 8.6*  BILIDIR 3.8*  --   --   IBILI 1.8*  --   --    < > = values in this interval not displayed.   PT/INR Recent Labs    10/20/2018 1957 10/15/18 1358  LABPROT 16.0* 16.5*  INR 1.29 1.34   Hepatitis Panel Recent Labs    10/16/2018 2322  HEPBSAG Negative  HCVAB 5.5*  HEPAIGM Negative  HEPBIGM Negative   C-Diff No results for input(s): CDIFFTOX in the last 72 hours. Fecal Lactopherrin No results for input(s): FECLLACTOFRN in the last 72 hours.  Studies/Results: Nm Hepatobiliary Liver Func  Result Date: 10/15/2018 CLINICAL DATA:  Abdominal pain.  Abnormal CT scan. EXAM:  NUCLEAR MEDICINE HEPATOBILIARY IMAGING TECHNIQUE: Sequential images of the abdomen were obtained out to 60 minutes following intravenous administration of radiopharmaceutical. RADIOPHARMACEUTICALS:  5.4 mCi Tc-44m Choletec IV COMPARISON:  CT scan 10/14/2017 FINDINGS: Fairly prompt but somewhat heterogeneous uptake in the liver. The biliary tree is not visualized for certain and the gallbladder is never visualized at 2 hours. I do not see any obvious activity in the bowel. IMPRESSION: Findings suggest biliary obstruction with non visualization of the biliary tree and gallbladder at 2 hours. Electronically Signed   By: PMarijo SanesM.D.   On: 10/15/2018 10:23   Dg Pelvis Portable  Result Date: 10/15/2018 CLINICAL DATA:  Recent fall with pelvic pain, initial encounter EXAM: PORTABLE PELVIS 1-2 VIEWS COMPARISON:  CT from the previous day. FINDINGS: A portion of the pelvis is obscured by overlying bowel content. No acute fracture or dislocation is noted. Degenerative change of the hip joints and lumbar spine are seen. Scattered distended colon with fecal material within is again identified and stable. IMPRESSION: No acute fracture noted. Electronically Signed   By: MInez CatalinaM.D.   On: 10/15/2018 10:18   Mr Abdomen Mrcp Wo Contrast  Result Date: 10/16/2018 CLINICAL DATA:  Elevated  liver function tests. Abdominal distention. EXAM: MRI ABDOMEN WITHOUT CONTRAST  (INCLUDING MRCP) TECHNIQUE: Multiplanar multisequence MR imaging of the abdomen was performed. Heavily T2-weighted images of the biliary and pancreatic ducts were obtained, and three-dimensional MRCP images were rendered by post processing. COMPARISON:  Noncontrast CT on 10/04/2018 FINDINGS: Image degradation by motion artifact noted. Lower chest: Bibasilar atelectasis. Stable elevation of left hemidiaphragm. Hepatobiliary: No masses visualized on this unenhanced exam. The gallbladder is markedly distended and contains layering sludge. There is mild  diffuse gallbladder wall thickening and mild pericholecystic edema suspicious for acute cholecystitis. No evidence of biliary ductal dilatation. Pancreas: No mass or inflammatory process visualized on this unenhanced exam. Spleen:  Within normal limits in size. Adrenals/Urinary tract: Left renal cyst again noted. No evidence of masses or hydronephrosis. Stomach/Bowel: Marked dilatation of the nondependent transverse colon again seen, which may be due to constipation or ileus. Vascular/Lymphatic: No pathologically enlarged lymph nodes identified. No evidence of abdominal aortic aneurysm. Other:  None. Musculoskeletal:  No suspicious bone lesions identified. IMPRESSION: Markedly distended gallbladder with mild wall thickening and pericholecystic edema, highly suspicious for cholecystitis. No evidence of biliary obstruction. Stable colonic distension, which may be due to constipation or ileus. Bibasilar atelectasis. Electronically Signed   By: Earle Gell M.D.   On: 10/16/2018 21:13   US Abdomen Limited Ruq  Result Date: 10/15/2018 CLINICAL DATA:  Elevated LFTs EXAM: ULTRASOUND ABDOMEN LIMITED RIGHT UPPER QUADRANT COMPARISON:  CT from the previous day. FINDINGS: Gallbladder: Well distended with mild gallbladder wall thickening and pericholecystic fluid. Negative sonographic Murphy's sign is noted. No cholelithiasis is seen. Common bile duct: Diameter: 3.9 mm. Liver: Diffuse heterogeneity is noted with mild nodularity consistent with cirrhotic change. Portal vein is patent on color Doppler imaging with normal direction of blood flow towards the liver. IMPRESSION: Distended gallbladder with wall thickening although a negative sonographic Murphy sign is elicited. This could still represent cholecystitis in the appropriate clinical setting. Heterogeneity in the liver likely representing underlying hepatocellular disease. This could account for the poor uptake of radiotracer on the prior exam. No biliary ductal  dilatation is noted. Electronically Signed   By: Inez Catalina M.D.   On: 10/15/2018 17:02    Medications:  Scheduled: . budesonide (PULMICORT) nebulizer solution  0.25 mg Nebulization BID  . insulin aspart  0-9 Units Subcutaneous Q4H  . ipratropium-albuterol  3 mL Nebulization BID  . LORazepam  1 mg Intravenous Once   Continuous: . calcium gluconate    . piperacillin-tazobactam (ZOSYN)  IV 2.25 g (10/17/18 0604)  .  sodium bicarbonate (isotonic) infusion in sterile water 150 mL/hr at 10/17/18 0430    Assessment/Plan: 1) Elevated liver enzymes. 2) Cholelithiasis. 3) Leukocytosis.   The patient is encephalopathic.  He is confused and has protective mitts on his hands.  This was not the case, per the notes, at the time of admission.  He has a marked elevation in his liver enzymes and the current work up is negative.  His AP is extremely high and of ? Etiology.  The echo shows that he has a good EF, but there is a history of diastolic CHF.  Ischemic hepatopathy can be a culprit.  Toxins would be another high consideration.  There is evidence of cirrhosis on the admission RUQ U/S, which can result in significant exacerbation of any insult to the liver.  Plan: 1) Doppler ultrasound of the liver. 2) Continue to follow liver enzymes. 3) Check INR.  INR at the time of his change  in mental status was normal.  LOS: 3 days   Jaziya Obarr D 10/17/2018, 6:46 AM

## 2018-10-17 NOTE — Progress Notes (Signed)
Pharmacy Antibiotic Note  Javier Ho. is a 63 y.o. male admitted on 09/30/2018 with possible acute cholecystitis.  Pharmacy has been consulted for Zosyn dosing. Today is Day 4 of Zosyn.   WBC up to 18.5 today and Scr continues to worsen (up to 6.74). Patient remains afebrile. He also continues to have worsening encephalopathy. Due to this, patient may require a trial of dialysis. In preparation for this, will adjust Zosyn dose to be appropriate for both his current renal function and HD.   Plan: Zosyn 3.375g IV q12h Trend WBC, temp, renal function/HD sessions  F/U infectious work-up  Height: 6' 1"  (185.4 cm) Weight: 232 lb 12.9 oz (105.6 kg) IBW/kg (Calculated) : 79.9  Temp (24hrs), Avg:97.7 F (36.5 C), Min:97.4 F (36.3 C), Max:98 F (36.7 C)  Recent Labs  Lab 09/30/2018 1957 10/08/2018 2234 10/10/2018 2341 10/15/18 0723 10/16/18 0419 10/17/18 0408  WBC 15.9*  --   --  14.8* 16.9* 18.5*  CREATININE 6.12*  --   --  6.10* 6.45* 6.74*  LATICACIDVEN  --  0.76 0.63  --   --   --     Estimated Creatinine Clearance: 14.1 mL/min (A) (by C-G formula based on SCr of 6.74 mg/dL (H)).    Allergies  Allergen Reactions  . Sulfa Antibiotics Nausea And Vomiting    unknown  . Meperidine Other (See Comments)    "makes me crazy"   Antibiotics This Admission: Zosyn 1/20 >>  Microbiology Results: 1/21 BCx: NGTD 1/20 UCx: yeast  Jackson Latino, PharmD PGY1 Pharmacy Resident Phone [phone] 10/17/2018     3:00 PM

## 2018-10-17 NOTE — Progress Notes (Signed)
   Patient Status: MC IP  Assessment and Plan: Patient in need of venous access.    Acute kidney injury-- on chronic kidney disease stage IV Need temporary dialysis catheter  ______________________________________________________________________   History of Present Illness: Javier Ho. is a 63 y.o. male   Rising creatinine Worsening renal function Encephalopathy Metabolic acidosis Rising liver functions! Total Bili rising  Allergies and medications reviewed.   Review of Systems: A 12 point ROS discussed and pertinent positives are indicated in the HPI above.  All other systems are negative.   Vital Signs: BP 127/89 (BP Location: Right Arm)   Pulse 90   Temp 97.6 F (36.4 C) (Axillary)   Resp (!) 21   Ht 6' 1"  (1.854 m)   Wt 232 lb 12.9 oz (105.6 kg)   SpO2 94%   BMI 30.71 kg/m   Physical Exam Vitals signs reviewed.  Skin:    General: Skin is warm and dry.  Neurological:     Mental Status: He is disoriented.  Psychiatric:     Comments: confused      Imaging reviewed.   Labs:  COAGS: Recent Labs    10/11/2018 1957 10/15/18 1358 10/17/18 0938  INR 1.29 1.34 1.56  APTT 33 36  --     BMP: Recent Labs    10/25/2018 1957 10/15/18 0723 10/16/18 0419 10/17/18 0408  NA 130* 131* 135 137  K 3.7 4.0 3.6 3.5  CL 90* 95* 92* 87*  CO2 20* 15* 21* 24  GLUCOSE 224* 168* 145* 168*  BUN 114* 121* 131* 141*  CALCIUM 7.3* 6.9* 6.4* 5.8*  CREATININE 6.12* 6.10* 6.45* 6.74*  GFRNONAA 9* 9* 8* 8*  GFRAA 10* 10* 10* 9*    Scheduled for temporary dialysis catheter placement today Need consent from family   Electronically Signed: Lavonia Drafts, PA-C 10/17/2018, 12:04 PM   I spent a total of 15 minutes in face to face in clinical consultation, greater than 50% of which was counseling/coordinating care for venous access.

## 2018-10-17 NOTE — Progress Notes (Addendum)
Patient ID: Javier Craw., male   DOB: [birth year], 63 y.o.   MRN: [id number]   Have attempted to call family and friend many times today-for consent for  Temp cath placement in IR  No Answer  Will attempt in am

## 2018-10-18 ENCOUNTER — Encounter (HOSPITAL_COMMUNITY): Payer: Self-pay | Admitting: Diagnostic Radiology

## 2018-10-18 ENCOUNTER — Inpatient Hospital Stay (HOSPITAL_COMMUNITY): Payer: Medicare HMO

## 2018-10-18 HISTORY — PX: IR US GUIDE VASC ACCESS RIGHT: IMG2390

## 2018-10-18 LAB — AMMONIA: Ammonia: 34 umol/L (ref 9–35)

## 2018-10-18 LAB — COMPREHENSIVE METABOLIC PANEL
ALT: 321 U/L — ABNORMAL HIGH (ref 0–44)
AST: 2532 U/L — ABNORMAL HIGH (ref 15–41)
Albumin: 2.1 g/dL — ABNORMAL LOW (ref 3.5–5.0)
Alkaline Phosphatase: 1716 U/L — ABNORMAL HIGH (ref 38–126)
Anion gap: 26 — ABNORMAL HIGH (ref 5–15)
BUN: 157 mg/dL — ABNORMAL HIGH (ref 8–23)
CO2: 28 mmol/L (ref 22–32)
Calcium: 6 mg/dL — CL (ref 8.9–10.3)
Chloride: 86 mmol/L — ABNORMAL LOW (ref 98–111)
Creatinine, Ser: 7.29 mg/dL — ABNORMAL HIGH (ref 0.61–1.24)
GFR, EST AFRICAN AMERICAN: 8 mL/min — AB (ref >60)
GFR, EST NON AFRICAN AMERICAN: 7 mL/min — AB (ref >60)
Glucose, Bld: 159 mg/dL — ABNORMAL HIGH (ref 70–99)
Potassium: 3.6 mmol/L (ref 3.5–5.1)
Sodium: 140 mmol/L (ref 135–145)
Total Bilirubin: 10 mg/dL — ABNORMAL HIGH (ref 0.3–1.2)
Total Protein: 6.7 g/dL (ref 6.5–8.1)

## 2018-10-18 LAB — CBC
HCT: 29.2 % — ABNORMAL LOW (ref 39.0–52.0)
Hemoglobin: 9.9 g/dL — ABNORMAL LOW (ref 13.0–17.0)
MCH: 28 pg (ref 26.0–34.0)
MCHC: 33.9 g/dL (ref 30.0–36.0)
MCV: 82.5 fL (ref 80.0–100.0)
NRBC: 0.1 % (ref 0.0–0.2)
Platelets: 266 10*3/uL (ref 150–400)
RBC: 3.54 MIL/uL — AB (ref 4.22–5.81)
RDW: 14.9 % (ref 11.5–15.5)
WBC: 20 10*3/uL — ABNORMAL HIGH (ref 4.0–10.5)

## 2018-10-18 LAB — GLUCOSE, CAPILLARY
Glucose-Capillary: 164 mg/dL — ABNORMAL HIGH (ref 70–99)
Glucose-Capillary: 161 mg/dL — ABNORMAL HIGH (ref 70–99)
Glucose-Capillary: 155 mg/dL — ABNORMAL HIGH (ref 70–99)
Glucose-Capillary: 166 mg/dL — ABNORMAL HIGH (ref 70–99)

## 2018-10-18 MED ORDER — LIDOCAINE HCL 1 % IJ SOLN
INTRAMUSCULAR | Status: AC
  Filled 2018-10-18: qty 20

## 2018-10-18 MED ORDER — PENTAFLUOROPROP-TETRAFLUOROETH EX AERO: 1.0000 "application " | INHALATION_SPRAY | CUTANEOUS | Status: DC | PRN

## 2018-10-18 MED ORDER — ALTEPLASE 2 MG IJ SOLR
2.0000 mg | Freq: Once | INTRAMUSCULAR | Status: DC | PRN
  Filled 2018-10-18: qty 2

## 2018-10-18 MED ORDER — LORAZEPAM 2 MG/ML IJ SOLN
0.5000 mg | Freq: Once | INTRAMUSCULAR | Status: AC
  Administered 2018-10-18: 0.5 mg via INTRAVENOUS

## 2018-10-18 MED ORDER — HALOPERIDOL LACTATE 5 MG/ML IJ SOLN: 5.0000 mg | Freq: Once | INTRAMUSCULAR | Status: DC

## 2018-10-18 MED ORDER — HEPARIN SODIUM (PORCINE) 1000 UNIT/ML IJ SOLN
INTRAMUSCULAR | Status: AC
  Filled 2018-10-18: qty 1

## 2018-10-18 MED ORDER — LORAZEPAM 2 MG/ML IJ SOLN
INTRAMUSCULAR | Status: AC
  Filled 2018-10-18: qty 1

## 2018-10-18 MED ORDER — HEPARIN SODIUM (PORCINE) 1000 UNIT/ML DIALYSIS
40.0000 [IU]/kg | INTRAMUSCULAR | Status: DC | PRN
  Filled 2018-10-18: qty 5

## 2018-10-18 MED ORDER — LIDOCAINE-PRILOCAINE 2.5-2.5 % EX CREA
1.0000 "application " | TOPICAL_CREAM | CUTANEOUS | Status: DC | PRN
  Filled 2018-10-18: qty 5

## 2018-10-18 MED ORDER — HYDROCODONE-ACETAMINOPHEN 5-325 MG PO TABS
2.0000 | ORAL_TABLET | Freq: Once | ORAL | Status: AC
  Administered 2018-10-18: 2 via ORAL
  Filled 2018-10-18: qty 2

## 2018-10-18 MED ORDER — ALPRAZOLAM 0.5 MG PO TABS
1.0000 mg | ORAL_TABLET | Freq: Once | ORAL | Status: AC
  Administered 2018-10-18: 1 mg via ORAL
  Filled 2018-10-18: qty 2

## 2018-10-18 MED ORDER — LIDOCAINE HCL (PF) 1 % IJ SOLN: 5.0000 mL | INTRAMUSCULAR | Status: DC | PRN

## 2018-10-18 MED ORDER — SODIUM CHLORIDE 0.9 % IV SOLN: 100.0000 mL | INTRAVENOUS | Status: DC | PRN

## 2018-10-18 MED ORDER — HEPARIN SODIUM (PORCINE) 1000 UNIT/ML DIALYSIS
1000.0000 [IU] | INTRAMUSCULAR | Status: DC | PRN
  Filled 2018-10-18 (×4): qty 1

## 2018-10-18 MED ORDER — LIDOCAINE HCL (PF) 1 % IJ SOLN
INTRAMUSCULAR | Status: DC | PRN
  Administered 2018-10-18: 10 mL

## 2018-10-18 NOTE — Progress Notes (Signed)
Pt mental awareness seems to be much improved. He woke up aware of his surroundings unlike the last 2 nights that he was disorientated x4  on my shift. He was requesting something for anxiety as well as pain. See [facility]

## 2018-10-18 NOTE — Progress Notes (Signed)
CRITICAL VALUE ALERT  Critical Value:  Calcium: 6.0 received by Ailene Rud, RN  Date & Time Notied:  1/24 @ 0715  Provider Notified: Yes  Orders Received/Actions taken: Still pending

## 2018-10-18 NOTE — Progress Notes (Addendum)
Subjective: No acute events.  Objective: Vital signs in last 24 hours: Temp:  [97.4 F (36.3 C)-97.6 F (36.4 C)] 97.4 F (36.3 C) (01/24 0545) Pulse Rate:  [79-103] 80 (01/24 0545) Resp:  [19-26] 21 (01/24 0545) BP: (102-131)/(65-89) 122/72 (01/24 0545) SpO2:  [93 %-97 %] 97 % (01/24 0545) Weight:  [105.8 kg] 105.8 kg (01/24 0545) Last BM Date: 10/17/18  Intake/Output from previous day: 01/23 0701 - 01/24 0700 In: 1716.3 [P.O.:120; I.V.:1437.2; IV Piggyback:159] Out: 725 [Urine:725] Intake/Output this shift: Total I/O In: 803.8 [P.O.:120; I.V.:608.3; IV Piggyback:75.5] Out: 200 [Urine:200]  General appearance: disoriented and extremely restless, thrashing about in his bed GI: soft, non-tender; bowel sounds normal; no masses,  no organomegaly  Lab Results: Recent Labs    10/16/18 0419 10/17/18 0408 10/18/18 0407  WBC 16.9* 18.5* 20.0*  HGB 9.3* 9.0* 9.9*  HCT 27.6* 26.6* 29.2*  PLT 235 250 266   BMET Recent Labs    10/15/18 0723 10/16/18 0419 10/17/18 0408  NA 131* 135 137  K 4.0 3.6 3.5  CL 95* 92* 87*  CO2 15* 21* 24  GLUCOSE 168* 145* 168*  BUN 121* 131* 141*  CREATININE 6.10* 6.45* 6.74*  CALCIUM 6.9* 6.4* 5.8*   LFT Recent Labs    10/15/18 0723  10/17/18 0408 10/17/18 2130  PROT 6.4*   < > 6.5  --   ALBUMIN 2.3*   < > 2.1*  --   AST 1,475*   < > 2,623*  --   ALT 737*   < > 741* 488*  ALKPHOS 890*   < > 1,390*  --   BILITOT 5.6*   < > 8.6*  --   BILIDIR 3.8*  --   --   --   IBILI 1.8*  --   --   --    < > = values in this interval not displayed.   PT/INR Recent Labs    10/15/18 1358 10/17/18 0938  LABPROT 16.5* 18.5*  INR 1.34 1.56   Hepatitis Panel No results for input(s): HEPBSAG, HCVAB, HEPAIGM, HEPBIGM in the last 72 hours. C-Diff No results for input(s): CDIFFTOX in the last 72 hours. Fecal Lactopherrin No results for input(s): FECLLACTOFRN in the last 72 hours.  Studies/Results: Mr Abdomen Mrcp Wo Contrast  Result Date:  10/16/2018 CLINICAL DATA:  Elevated liver function tests. Abdominal distention. EXAM: MRI ABDOMEN WITHOUT CONTRAST  (INCLUDING MRCP) TECHNIQUE: Multiplanar multisequence MR imaging of the abdomen was performed. Heavily T2-weighted images of the biliary and pancreatic ducts were obtained, and three-dimensional MRCP images were rendered by post processing. COMPARISON:  Noncontrast CT on 10/05/2018 FINDINGS: Image degradation by motion artifact noted. Lower chest: Bibasilar atelectasis. Stable elevation of left hemidiaphragm. Hepatobiliary: No masses visualized on this unenhanced exam. The gallbladder is markedly distended and contains layering sludge. There is mild diffuse gallbladder wall thickening and mild pericholecystic edema suspicious for acute cholecystitis. No evidence of biliary ductal dilatation. Pancreas: No mass or inflammatory process visualized on this unenhanced exam. Spleen:  Within normal limits in size. Adrenals/Urinary tract: Left renal cyst again noted. No evidence of masses or hydronephrosis. Stomach/Bowel: Marked dilatation of the nondependent transverse colon again seen, which may be due to constipation or ileus. Vascular/Lymphatic: No pathologically enlarged lymph nodes identified. No evidence of abdominal aortic aneurysm. Other:  None. Musculoskeletal:  No suspicious bone lesions identified. IMPRESSION: Markedly distended gallbladder with mild wall thickening and pericholecystic edema, highly suspicious for cholecystitis. No evidence of biliary obstruction. Stable colonic distension, which may  be due to constipation or ileus. Bibasilar atelectasis. Electronically Signed   By: Earle Gell M.D.   On: 10/16/2018 21:13   US Liver Doppler  Result Date: 10/17/2018 CLINICAL DATA:  Elevated liver function tests EXAM: DUPLEX ULTRASOUND OF LIVER TECHNIQUE: Color and duplex Doppler ultrasound was performed to evaluate the hepatic in-flow and out-flow vessels. COMPARISON:  None. FINDINGS: Portal Vein  Velocities Main:  65 cm/sec Right:  43 cm/sec Left:  36 cm/sec Hepatic Vein Velocities Right:  65 cm/sec Middle:  64 cm/sec Left:  64 cm/sec Hepatic Artery Velocity:  90 cm/sec Splenic Vein Velocity:  Not visualized Varices: Absent Ascites: Trace ascites is present. The liver is diffusely heterogeneous with a nodular contour. This is compatible with cirrhotic change throughout the liver. Hepatic veins are patent with hepatofugal flow. The portal vein and its branches are hepatopetal inflow. The spleen is within normal limits in size. The splenic vein could not be clearly identified it has patency can not be confirmed by this study. IMPRESSION: Hepatic and portal veins are patent with normal directionality of flow. The splenic vein was not visualized. It has patency can not be confirmed by this study. Cirrhotic liver. Trace ascites. Electronically Signed   By: Marybelle Killings M.D.   On: 10/17/2018 09:34    Medications:  Scheduled: . budesonide (PULMICORT) nebulizer solution  0.25 mg Nebulization BID  . Chlorhexidine Gluconate Cloth  6 each Topical Q0600  . insulin aspart  0-9 Units Subcutaneous Q4H  . ipratropium-albuterol  3 mL Nebulization BID  . LORazepam  1 mg Intravenous Once   Continuous: . piperacillin-tazobactam (ZOSYN)  IV Stopped (10/18/18 0430)  .  sodium bicarbonate (isotonic) infusion in sterile water 150 mL/hr at 10/18/18 5183    Assessment/Plan: 1) AMS. 2) Cirrhosis. 3) Leukocytosis. 4) Renal failure.   The evening nurse reported that his sensorium was better, however, during my examination he was disoriented.  There is an improvement compared to yesterday as he was thrashing around in his bed unaware of his surroundings.  This AM he was awake, but disoriented.  The doppler U/S was showed normal flow.  The GGT is elevated, which confirms the hepatic source for his AP.  The current work up for his acute on chronic liver injury is not known.  Hopefully his sensorium was become clearer  and remain clear consistently.  Plan: 1) Continue with supportive care.  ADDENDUM: The patient has imaging that is consistent with a mild cholecystitis.  This can fit the current clinical presentation, which is altered mental status and markedly elevated liver enzymes, especially the AP.  AP values greater than 1000 are consistent with severe bacteremia.  His blood cultures are negative, however, he has Candida in his urine.  Surgery was consulted at the time of admission, but he was deemed a high surgical risk.  Dr. Dema Severin mentioned a percutaneous cholecystostomy tube.  IR will be consulted.  LOS: 4 days   Javier Ho D 10/18/2018, 6:39 AM

## 2018-10-18 NOTE — Progress Notes (Signed)
Pt pulled RIJ HD cath before shift change. PA Blount called & message sent. Will continue to monitor pt.

## 2018-10-18 NOTE — Progress Notes (Signed)
Patient ID: Javier Craw., male   DOB: 06-23-1956, 63 y.o.   MRN: [id number] [facility] Progress Note   Assessment/ Plan:   1.  Acute kidney injury on chronic kidney disease stage IV:  Renal function continues to worsen with fair urine output.  Today we will begin a trial of dialysis (possibly 3 or 4 treatments) to try and see if we can improve on his encephalopathy and make for better guidance with decision making as he gets more lucid.  Overall, the etiology of his acute liver injury remains unclear.  Difficult situation with prognostication and value of dialysis on his life. 2.  Anion gap metabolic acidosis: Secondary to acute kidney injury/ketosis-corrected with isotonic bicarbonate. 3.  Encephalopathy: Likely metabolic encephalopathy with multiple incriminating etiologies including acute liver injury/SIRS and possibly uremia.  Plan to start dialysis today. 4.    Awaited LFTs/bilirubin: With positive HIDA scan in the background of suggestive lab and clinical findings including markedly elevated transaminases, hyperbilirubinemia and abdominal tenderness.  Ongoing continued follow-up and work-up by gastroenterology. 5.  History of diastolic heart failure: Appears to be compensated-we will monitor with dialysis. 6.  Anemia: Likely anemia of chronic kidney disease versus critical illness based on current presentation.    With low iron saturation however, avoiding iron at this time with acute liver injury.   Subjective:   Mental status waxing and waning.  Earlier today underwent successful placement of a right IJ temporary dialysis catheter by radiology (challenging because of the patient's inability to lay still).  During my encounter with him he asked me (twice, and witnessed by nurse at bedside) "can't I just go home and die?".   Objective:   BP 112/78   Pulse 94   Temp 98.6 F (37 C) (Axillary)   Resp 20   Ht 6' 1"  (1.854 m)   Wt 105.8 kg   SpO2 96%   BMI 30.77 kg/m    Intake/Output Summary (Last 24 hours) at 10/18/2018 1355 Last data filed at 10/18/2018 1300 Gross per 24 hour  Intake 1488.08 ml  Output 200 ml  Net 1288.08 ml   Weight change: 0.2 kg  Physical Exam: Gen: Continues to appear uncomfortable resting in bed, repositioning himself constantly CVS: Pulse regular rhythm, normal rate, S1 and S2 normal Resp: Anteriorly clear to auscultation, no rales/rhonchi.  Right IJ temporary catheter Abd: Soft, obese, nontender Ext: No lower extremity edema  Imaging: Mr Abdomen Mrcp Wo Contrast  Result Date: 10/16/2018 CLINICAL DATA:  Elevated liver function tests. Abdominal distention. EXAM: MRI ABDOMEN WITHOUT CONTRAST  (INCLUDING MRCP) TECHNIQUE: Multiplanar multisequence MR imaging of the abdomen was performed. Heavily T2-weighted images of the biliary and pancreatic ducts were obtained, and three-dimensional MRCP images were rendered by post processing. COMPARISON:  Noncontrast CT on 10/02/2018 FINDINGS: Image degradation by motion artifact noted. Lower chest: Bibasilar atelectasis. Stable elevation of left hemidiaphragm. Hepatobiliary: No masses visualized on this unenhanced exam. The gallbladder is markedly distended and contains layering sludge. There is mild diffuse gallbladder wall thickening and mild pericholecystic edema suspicious for acute cholecystitis. No evidence of biliary ductal dilatation. Pancreas: No mass or inflammatory process visualized on this unenhanced exam. Spleen:  Within normal limits in size. Adrenals/Urinary tract: Left renal cyst again noted. No evidence of masses or hydronephrosis. Stomach/Bowel: Marked dilatation of the nondependent transverse colon again seen, which may be due to constipation or ileus. Vascular/Lymphatic: No pathologically enlarged lymph nodes identified. No evidence of abdominal aortic aneurysm. Other:  None. Musculoskeletal:  No suspicious bone lesions identified. IMPRESSION: Markedly distended gallbladder with  mild wall thickening and pericholecystic edema, highly suspicious for cholecystitis. No evidence of biliary obstruction. Stable colonic distension, which may be due to constipation or ileus. Bibasilar atelectasis. Electronically Signed   By: Earle Gell M.D.   On: 10/16/2018 21:13   Ir US Guide Vasc Access Right  Result Date: 10/18/2018 INDICATION: 63 year old with acute on chronic renal failure. Encephalopathy. Request for non tunneled dialysis catheter. EXAM: FLUOROSCOPIC AND ULTRASOUND GUIDED PLACEMENT OF A NON-TUNNELED DIALYSIS CATHETER Physician: Stephan Minister. Henn, MD MEDICATIONS: None ANESTHESIA/SEDATION: Ativan 0.5 mg prior to the procedure FLUOROSCOPY TIME:  Fluoroscopy Time:  minutes  seconds ( mGy). COMPLICATIONS: None immediate. PROCEDURE: Patient is encephalopathic and can not give informed consent. There is no family or POA available for consent. Therefore, emergency consent was performed. Patient was combative and unable to perform the procedure on the fluoroscopic table. Procedure was performed in the patient's bed. Ultrasound confirmed a patent right internal jugular vein. Ultrasound images were obtained for documentation. The right side of the neck was prepped and draped in a sterile fashion. The right side of the neck was anesthetized with 1% lidocaine. Maximal barrier sterile technique was utilized including caps, mask, sterile gowns, sterile gloves, sterile drape, hand hygiene and skin antiseptic. A small incision was made with #11 blade scalpel. A 21 gauge needle directed into the right internal jugular vein with ultrasound guidance. A micropuncture dilator set was placed. A 16 cm Mahurkar catheter was selected. The catheter was advanced easily over the stiff Amplatz wire. Both dialysis lumens were found to aspirate and flush well. The proper amount of heparin was flushed in both lumens. The central venous lumen was flushed with normal saline. Catheter was sutured to skin. Dressing was placed  over the catheter. Follow-up portable chest x-ray demonstrated that the central line was in the lower SVC and appropriately positioned. FINDINGS: Catheter tip in the lower SVC based on follow-up chest radiograph. IMPRESSION: Successful placement of a right jugular non-tunneled dialysis catheter using ultrasound guidance. Electronically Signed   By: Markus Daft M.D.   On: 10/18/2018 12:52   Dg Chest Port 1 View  Result Date: 10/18/2018 CLINICAL DATA:  Shortness of breath after right internal jugular dialysis catheter placement. EXAM: PORTABLE CHEST 1 VIEW COMPARISON:  Chest x-ray dated October 14, 2018. FINDINGS: Interval placement of a right internal jugular dialysis catheter with the tip in the proximal right atrium. Stable cardiomediastinal silhouette. Normal pulmonary vascularity. Unchanged low lung volumes with mild bibasilar atelectasis. Unchanged elevation of the left hemidiaphragm. No focal consolidation, pleural effusion, or pneumothorax. No acute osseous abnormality. IMPRESSION: 1. No active disease. 2. Appropriately positioned right internal jugular dialysis catheter. Electronically Signed   By: Titus Dubin M.D.   On: 10/18/2018 12:41   US Liver Doppler  Result Date: 10/17/2018 CLINICAL DATA:  Elevated liver function tests EXAM: DUPLEX ULTRASOUND OF LIVER TECHNIQUE: Color and duplex Doppler ultrasound was performed to evaluate the hepatic in-flow and out-flow vessels. COMPARISON:  None. FINDINGS: Portal Vein Velocities Main:  65 cm/sec Right:  43 cm/sec Left:  36 cm/sec Hepatic Vein Velocities Right:  65 cm/sec Middle:  64 cm/sec Left:  64 cm/sec Hepatic Artery Velocity:  90 cm/sec Splenic Vein Velocity:  Not visualized Varices: Absent Ascites: Trace ascites is present. The liver is diffusely heterogeneous with a nodular contour. This is compatible with cirrhotic change throughout the liver. Hepatic veins are patent with hepatofugal flow. The portal vein and its branches  are hepatopetal inflow.  The spleen is within normal limits in size. The splenic vein could not be clearly identified it has patency can not be confirmed by this study. IMPRESSION: Hepatic and portal veins are patent with normal directionality of flow. The splenic vein was not visualized. It has patency can not be confirmed by this study. Cirrhotic liver. Trace ascites. Electronically Signed   By: Marybelle Killings M.D.   On: 10/17/2018 09:34    Labs: BMET Recent Labs  Lab 10/12/2018 1957 10/11/2018 2225 10/15/18 0723 10/16/18 0419 10/17/18 0408 10/18/18 0407  NA 130*  --  131* 135 137 140  K 3.7  --  4.0 3.6 3.5 3.6  CL 90*  --  95* 92* 87* 86*  CO2 20*  --  15* 21* 24 28  GLUCOSE 224*  --  168* 145* 168* 159*  BUN 114*  --  121* 131* 141* 157*  CREATININE 6.12*  --  6.10* 6.45* 6.74* 7.29*  CALCIUM 7.3*  --  6.9* 6.4* 5.8* 6.0*  PHOS  --  7.1*  --  9.7*  --   --    CBC Recent Labs  Lab 10/03/2018 1957 10/15/18 0723 10/15/18 1358 10/16/18 0419 10/17/18 0408 10/18/18 0407  WBC 15.9* 14.8*  --  16.9* 18.5* 20.0*  NEUTROABS 14.5* 13.5*  --   --   --   --   HGB 9.7* 9.2*  --  9.3* 9.0* 9.9*  HCT 30.4* 28.1*  --  27.6* 26.6* 29.2*  MCV 86.9 85.2  --  83.6 81.3 82.5  PLT 233 210 212 235 250 266    Medications:    . budesonide (PULMICORT) nebulizer solution  0.25 mg Nebulization BID  . Chlorhexidine Gluconate Cloth  6 each Topical Q0600  . heparin      . heparin      . insulin aspart  0-9 Units Subcutaneous Q4H  . ipratropium-albuterol  3 mL Nebulization BID  . lidocaine      . lidocaine      . LORazepam      . LORazepam  1 mg Intravenous Once   Elmarie Shiley, MD 10/18/2018, 1:55 PM

## 2018-10-18 NOTE — Progress Notes (Addendum)
PROGRESS NOTE  Javier Ho. WUJ:[id number] DOB: April 02, 1956 DOA: 10/03/2018 PCP: Deloria Lair., MD   LOS: 4 days   Brief Narrative / Interim history: 63 y.o.malewithhistory of COPD, diastolic CHF, chronic kidney disease stage IV, chronic anemia, diabetes mellitus type 2, anxiety and depression admitted with weakness and multiple falls the last 1 week. On admission, found to have markedly elevated liver enzymes, worsening renal function with uremia and nonspecific EKG changes with multiple PVCs.  CT head without acute intracranial finding.  Portable chest x-ray without acute cardiopulmonary finding.  CT renal stone revealed distended gallbladder and severe constipation possible fecal impaction.  HIDA positive.  Patient could not tolerate MRCP. General surgery, GI and nephrology consulted.  Subjective: -More alert per night nurse, woke up and required something for anxiety along with pain medications.  Encephalopathic on my admission, barely wakes up but cannot stay awake.  Confused.  Assessment & Plan: Active Problems:   COPD (chronic obstructive pulmonary disease) (HCC)   Chronic use of opiate for therapeutic purpose   Diabetes mellitus with nephropathy (Javier Ho)   ARF (acute renal failure) (HCC)   Urinary retention   Lower urinary tract infectious disease   Hepatitis   Frequent falls   Bronchitis   Acute renal failure superimposed on chronic kidney disease (HCC)   Principal Problem Markedly elevated liver enzymes/hyperbilirubinemia/alkaline phosphatase: concern for cholecystitis/biliary obstruction  -General surgery consulted, does not appear that gallbladder is an issue even with positive HIDA scan, suspect abnormal HIDA scan because of hepatocellular issues -LFTs continued to get worse, GI was consulted, Dr. Benson Norway following, liver Doppler ultrasound negative for abscess but did show cirrhotic liver -Discussed with patient's friend over the phone who lives with the patient, no  reports of abusing Percocet or any other toxins/drugs.  Tylenol level was negative on admission -d/w Dr. Benson Norway, given persistent abnormal imaging to gallbladder go ahead and place per chole drain  Active Problems AKI on CKD/uremia/hyperphosphatemia  -CT renal stone study did not reveal any obstructive etiology.  Only on low-dose aspirin and Lasix at home. -Neurology following -Renal function continues to get worse, discussed with Dr. Posey Pronto, start hemodialysis.  Patient with difficulties in having dialysis catheter placed due to agitation.  We will try some Ativan.  If that fails will consult critical care for potentially Precedex and catheter placement  Acute toxic metabolic encephalopathy -Mental status slightly worse, likely multifactorial in the setting of liver as well as renal failure -Continue stepdown monitoring: Low threshold to transfer to ICU. -Recheck an ammonia level today.  It was actually normal a few days back  Anion gap metabolic acidosis -Likely due to uremia and renal failure as above -Manage as above  Chronic diastolic CHF -EF 55 to 95% with G1 DD.  No signs of fluid overload on exam.  No respiratory distress. -Monitor, no significant evidence of fluid overload  Multiple falls -Could be due to the above.  CT head without acute intracranial finding.  DM2  -Last A1c 7.9% in 2018.  On 50 units of Lantus at home. -Continue sliding scale, CBGs controlled, now n.p.o. due to encephalopathy  COPD -No wheezing, stable -Continue breathing treatments.  Multiple PVCs -Likely due to electrolyte issues with renal failure.  Goals of care -Patient quite confused, tried calling all contact numbers was finally able to get in touch with his friend who is aware of patient's hospitalization as she was the one that brought him to the hospital however patient does not have a POA or  any family around.  Discussion with friend over the phone was quite difficult on 1/23 as she with  repeatedly fall asleep during our conversation  Diarrhea -Continue to get worse, white count goes up, he now has a rectal pouch.  Send C. difficile   Scheduled Meds: . LORazepam      . budesonide (PULMICORT) nebulizer solution  0.25 mg Nebulization BID  . Chlorhexidine Gluconate Cloth  6 each Topical Q0600  . heparin      . insulin aspart  0-9 Units Subcutaneous Q4H  . ipratropium-albuterol  3 mL Nebulization BID  . lidocaine      . LORazepam  1 mg Intravenous Once   Continuous Infusions: . piperacillin-tazobactam (ZOSYN)  IV Stopped (10/18/18 0430)  .  sodium bicarbonate (isotonic) infusion in sterile water 150 mL/hr at 10/18/18 0857   PRN Meds:.hydrALAZINE, ipratropium-albuterol, ondansetron **OR** ondansetron (ZOFRAN) IV  DVT prophylaxis: SCDs Code Status: Full code Family Communication: no family at bedside  Disposition Plan: TBD  Consultants:   GI  Surgery  Nephrology   Procedures:   2D echo Impressions: - Technically difficult study. LVEF 65-70%, normal wall thickness, normal wall motion, grade 1 DD, indeterminate LV filling pressure, normal LA size, mild RAE  Antimicrobials:  Zosyn 1/20 >>   Objective: Vitals:   10/17/18 2331 10/18/18 0545 10/18/18 0732 10/18/18 0801  BP: 109/65 122/72 (!) 90/55   Pulse: 79 80 91   Resp: (!) 22 (!) 21 20   Temp:  (!) 97.4 F (36.3 C) 98.6 F (37 C)   TempSrc:  Axillary Axillary   SpO2: 95% 97% 93% 95%  Weight:  105.8 kg    Height:        Intake/Output Summary (Last 24 hours) at 10/18/2018 1113 Last data filed at 10/18/2018 0546 Gross per 24 hour  Intake 1488.08 ml  Output 200 ml  Net 1288.08 ml   Filed Weights   10/16/18 0404 10/17/18 0445 10/18/18 0545  Weight: 106.9 kg 105.6 kg 105.8 kg    Examination:  Constitutional: Overall more icteric today, more encephalopathic, intermittently agitated Eyes: Scleral icterus present ENMT: Dry mucous membranes Neck: normal, supple Respiratory: No wheezing, no  crackles, shallow respirations but clear on anterior auscultation Cardiovascular: Regular rate and rhythm, no peripheral edema Abdomen: Slightly distended, no tenderness palpation Musculoskeletal: no clubbing / cyanosis.  Skin: No rashes appreciated, icteric Neurologic: No focal deficits, does not follow commands consistently  Data Reviewed: I have independently reviewed following labs and imaging studies   CBC: Recent Labs  Lab 09/28/2018 1957 10/15/18 0723 10/15/18 1358 10/16/18 0419 10/17/18 0408 10/18/18 0407  WBC 15.9* 14.8*  --  16.9* 18.5* 20.0*  NEUTROABS 14.5* 13.5*  --   --   --   --   HGB 9.7* 9.2*  --  9.3* 9.0* 9.9*  HCT 30.4* 28.1*  --  27.6* 26.6* 29.2*  MCV 86.9 85.2  --  83.6 81.3 82.5  PLT 233 210 212 235 250 109   Basic Metabolic Panel: Recent Labs  Lab 10/21/2018 1957 10/25/2018 2225 10/15/18 0723 10/16/18 0419 10/17/18 0408 10/18/18 0407  NA 130*  --  131* 135 137 140  K 3.7  --  4.0 3.6 3.5 3.6  CL 90*  --  95* 92* 87* 86*  CO2 20*  --  15* 21* 24 28  GLUCOSE 224*  --  168* 145* 168* 159*  BUN 114*  --  121* 131* 141* 157*  CREATININE 6.12*  --  6.10* 6.45* 6.74*  7.29*  CALCIUM 7.3*  --  6.9* 6.4* 5.8* 6.0*  MG  --  2.6*  --   --   --   --   PHOS  --  7.1*  --  9.7*  --   --    GFR: Estimated Creatinine Clearance: 13.1 mL/min (A) (by C-G formula based on SCr of 7.29 mg/dL (H)). Liver Function Tests: Recent Labs  Lab 10/10/2018 1957 10/15/18 0723 10/16/18 0419 10/17/18 0408 10/17/18 2130 10/18/18 0407  AST 1,474* 1,475* 2,002* 2,623*  --  2,532*  ALT 800* 737* 854* 741* 488* 321*  ALKPHOS 911* 890* 1,331* 1,390*  --  1,716*  BILITOT 5.8* 5.6* 7.2* 8.6*  --  10.0*  PROT 7.5 6.4* 6.9 6.5  --  6.7  ALBUMIN 2.8* 2.3* 2.3* 2.1*  --  2.1*   No results for input(s): LIPASE, AMYLASE in the last 168 hours. Recent Labs  Lab 10/19/2018 2225  AMMONIA 23   Coagulation Profile: Recent Labs  Lab 10/07/2018 1957 10/15/18 1358 10/17/18 0938  INR 1.29  1.34 1.56   Cardiac Enzymes: Recent Labs  Lab 10/15/2018 2225 10/15/18 0723 10/15/18 1705 10/15/18 1933  CKTOTAL 56 58  --   --   CKMB  --  1.9  --   --   TROPONINI  --  0.06* 0.05* 0.04*   BNP (last 3 results) No results for input(s): PROBNP in the last 8760 hours. HbA1C: Recent Labs    10/15/18 1358  HGBA1C 7.7*   CBG: Recent Labs  Lab 10/17/18 1639 10/17/18 1923 10/17/18 2326 10/18/18 0536 10/18/18 0735  GLUCAP 142* 176* 131* 164* 161*   Lipid Profile: No results for input(s): CHOL, HDL, LDLCALC, TRIG, CHOLHDL, LDLDIRECT in the last 72 hours. Thyroid Function Tests: No results for input(s): TSH, T4TOTAL, FREET4, T3FREE, THYROIDAB in the last 72 hours. Anemia Panel: Recent Labs    10/15/18 1933  FERRITIN 984*  TIBC 216*  IRON 36*   Urine analysis:    Component Value Date/Time   COLORURINE AMBER (A) 10/04/2018 2250   APPEARANCEUR HAZY (A) 10/24/2018 2250   LABSPEC 1.011 10/10/2018 2250   PHURINE 5.0 09/25/2018 2250   GLUCOSEU NEGATIVE 09/30/2018 2250   HGBUR SMALL (A) 10/04/2018 2250   BILIRUBINUR NEGATIVE 10/01/2018 2250   [place] 10/18/2018 2250   PROTEINUR 30 (A) 10/10/2018 2250   NITRITE NEGATIVE 10/12/2018 2250   LEUKOCYTESUR NEGATIVE 10/20/2018 2250   Sepsis Labs: Invalid input(s): PROCALCITONIN, LACTICIDVEN  Recent Results (from the past 240 hour(s))  Culture, blood (routine x 2)     Status: None (Preliminary result)   Collection Time: 10/21/2018 10:26 PM  Result Value Ref Range Status   Specimen Description BLOOD SITE NOT SPECIFIED  Final   Special Requests   Final    BOTTLES DRAWN AEROBIC AND ANAEROBIC Blood Culture adequate volume   Culture   Final    NO GROWTH 2 DAYS Performed at [facility], [address] [place], [place] [address]    Report Status PENDING  Incomplete  Urine culture     Status: Abnormal   Collection Time: 10/08/2018 11:34 PM  Result Value Ref Range Status   Specimen Description URINE, CATHETERIZED   Final   Special Requests   Final    NONE Performed at [facility], [place] [address], [place], [place] [address]    Culture >=100,000 COLONIES/mL YEAST (A)  Final   Report Status 10/16/2018 FINAL  Final  Culture, blood (routine x 2)  Status: None (Preliminary result)   Collection Time: 10/15/18 12:24 AM  Result Value Ref Range Status   Specimen Description BLOOD LEFT HAND  Final   Special Requests   Final    BOTTLES DRAWN AEROBIC AND ANAEROBIC Blood Culture results may not be optimal due to an inadequate volume of blood received in culture bottles   Culture   Final    NO GROWTH 2 DAYS Performed at [facility] [address], [place], [place] [address]    Report Status PENDING  Incomplete      Radiology Studies: Mr Abdomen Mrcp Wo Contrast  Result Date: 10/16/2018 CLINICAL DATA:  Elevated liver function tests. Abdominal distention. EXAM: MRI ABDOMEN WITHOUT CONTRAST  (INCLUDING MRCP) TECHNIQUE: Multiplanar multisequence MR imaging of the abdomen was performed. Heavily T2-weighted images of the biliary and pancreatic ducts were obtained, and three-dimensional MRCP images were rendered by post processing. COMPARISON:  Noncontrast CT on 10/05/2018 FINDINGS: Image degradation by motion artifact noted. Lower chest: Bibasilar atelectasis. Stable elevation of left hemidiaphragm. Hepatobiliary: No masses visualized on this unenhanced exam. The gallbladder is markedly distended and contains layering sludge. There is mild diffuse gallbladder wall thickening and mild pericholecystic edema suspicious for acute cholecystitis. No evidence of biliary ductal dilatation. Pancreas: No mass or inflammatory process visualized on this unenhanced exam. Spleen:  Within normal limits in size. Adrenals/Urinary tract: Left renal cyst again noted. No evidence of masses or hydronephrosis. Stomach/Bowel: Marked dilatation of the nondependent transverse colon again seen, which may be due to constipation or  ileus. Vascular/Lymphatic: No pathologically enlarged lymph nodes identified. No evidence of abdominal aortic aneurysm. Other:  None. Musculoskeletal:  No suspicious bone lesions identified. IMPRESSION: Markedly distended gallbladder with mild wall thickening and pericholecystic edema, highly suspicious for cholecystitis. No evidence of biliary obstruction. Stable colonic distension, which may be due to constipation or ileus. Bibasilar atelectasis. Electronically Signed   By: Earle Gell M.D.   On: 10/16/2018 21:13   US Liver Doppler  Result Date: 10/17/2018 CLINICAL DATA:  Elevated liver function tests EXAM: DUPLEX ULTRASOUND OF LIVER TECHNIQUE: Color and duplex Doppler ultrasound was performed to evaluate the hepatic in-flow and out-flow vessels. COMPARISON:  None. FINDINGS: Portal Vein Velocities Main:  65 cm/sec Right:  43 cm/sec Left:  36 cm/sec Hepatic Vein Velocities Right:  65 cm/sec Middle:  64 cm/sec Left:  64 cm/sec Hepatic Artery Velocity:  90 cm/sec Splenic Vein Velocity:  Not visualized Varices: Absent Ascites: Trace ascites is present. The liver is diffusely heterogeneous with a nodular contour. This is compatible with cirrhotic change throughout the liver. Hepatic veins are patent with hepatofugal flow. The portal vein and its branches are hepatopetal inflow. The spleen is within normal limits in size. The splenic vein could not be clearly identified it has patency can not be confirmed by this study. IMPRESSION: Hepatic and portal veins are patent with normal directionality of flow. The splenic vein was not visualized. It has patency can not be confirmed by this study. Cirrhotic liver. Trace ascites. Electronically Signed   By: Marybelle Killings M.D.   On: 10/17/2018 09:34    Marzetta Board, MD, PhD [facility]  Contact via  [email or web address]  [place] P: [phone]  F: [phone]

## 2018-10-18 NOTE — Progress Notes (Signed)
IR asked to consider perc chole drain for cholecystitis based on imaging. We saw this pt earlier today to place HD cath. Pt encephalopathic due to liver and renal failure, was extremely combative requiring 4 people to hold still to complete the procedure.  Imaging reviewed with Dr. Anselm Pancoast, do not feel HIDA scan is accurate due to liver failure. Has distended gb with only mild wall thickening, no stones.  Pt has no family and required emergent consent for placement of HD cath. He is a poor candidate for perc chole tube placement, high risk to pull out.  Feel more appropriate to treat conservatively. See if mental status improves with HD. Dr. Anselm Pancoast to d/w Dr. Benson Norway.  Ascencion Dike PA-C Interventional Radiology 10/18/2018 4:16 PM

## 2018-10-18 NOTE — Progress Notes (Signed)
Pt with some agitation, pulling at lines.  Hand mitts applied at 1700hrs.  Right before going to Dialysis, pt pulled HD cath out of R) side of neck.  MD notified.

## 2018-10-18 NOTE — Procedures (Signed)
Interventional Radiology Procedure:   Indications: ARF.  Encephalopathy.    Procedure: US guided placement of right jugular non-tunneled triple lumen dialysis catheter  Findings: 16 cm catheter placed.  Patent right IJ.  Patient was confused and combative throughout the procedure.   Complications: None     EBL: Minimal  Plan: CXR to check catheter placement.     Dreya Buhrman R. Anselm Pancoast, MD  Pager: [phone]

## 2018-10-18 NOTE — Progress Notes (Signed)
Transport came to me to let me know that pt's primary nurse told him pt would not be coming b/c he pulled out his cath. I didn't know if he meant foley or HD. I went to pt's room to speak w/ his primary nurse and saw they were cleaning pt up from where he pulled out his HD cath. Pt will not get HD tx tonight. Called Dr. Posey Pronto to make him aware.

## 2018-10-19 ENCOUNTER — Inpatient Hospital Stay (HOSPITAL_COMMUNITY): Payer: Medicare HMO

## 2018-10-19 DIAGNOSIS — N170 Acute kidney failure with tubular necrosis: Secondary | ICD-10-CM

## 2018-10-19 DIAGNOSIS — J449 Chronic obstructive pulmonary disease, unspecified: Secondary | ICD-10-CM

## 2018-10-19 DIAGNOSIS — G9341 Metabolic encephalopathy: Secondary | ICD-10-CM

## 2018-10-19 DIAGNOSIS — K759 Inflammatory liver disease, unspecified: Secondary | ICD-10-CM

## 2018-10-19 DIAGNOSIS — N184 Chronic kidney disease, stage 4 (severe): Secondary | ICD-10-CM

## 2018-10-19 LAB — POCT I-STAT 7, (LYTES, BLD GAS, ICA,H+H)
Acid-Base Excess: 11 mmol/L — ABNORMAL HIGH (ref 0.0–2.0)
Bicarbonate: 35.9 mmol/L — ABNORMAL HIGH (ref 20.0–28.0)
Calcium, Ion: 0.62 mmol/L — CL (ref 1.15–1.40)
HCT: 29 % — ABNORMAL LOW (ref 39.0–52.0)
Hemoglobin: 9.9 g/dL — ABNORMAL LOW (ref 13.0–17.0)
O2 Saturation: 96 %
PCO2 ART: 46.4 mmHg (ref 32.0–48.0)
Patient temperature: 98.6
Potassium: 3.3 mmol/L — ABNORMAL LOW (ref 3.5–5.1)
Sodium: 138 mmol/L (ref 135–145)
TCO2: 37 mmol/L — ABNORMAL HIGH (ref 22–32)
pH, Arterial: 7.496 — ABNORMAL HIGH (ref 7.350–7.450)
pO2, Arterial: 79 mmHg — ABNORMAL LOW (ref 83.0–108.0)

## 2018-10-19 LAB — GLUCOSE, CAPILLARY
GLUCOSE-CAPILLARY: 127 mg/dL — AB (ref 70–99)
Glucose-Capillary: 110 mg/dL — ABNORMAL HIGH (ref 70–99)
Glucose-Capillary: 151 mg/dL — ABNORMAL HIGH (ref 70–99)
Glucose-Capillary: 162 mg/dL — ABNORMAL HIGH (ref 70–99)
Glucose-Capillary: 166 mg/dL — ABNORMAL HIGH (ref 70–99)
Glucose-Capillary: 172 mg/dL — ABNORMAL HIGH (ref 70–99)

## 2018-10-19 LAB — C DIFFICILE QUICK SCREEN W PCR REFLEX
C Diff antigen: POSITIVE — AB
C Diff toxin: NEGATIVE

## 2018-10-19 LAB — PROTIME-INR
INR: 1.44
Prothrombin Time: 17.3 seconds — ABNORMAL HIGH (ref 11.4–15.2)

## 2018-10-19 LAB — RENAL FUNCTION PANEL
Albumin: 2.5 g/dL — ABNORMAL LOW (ref 3.5–5.0)
Anion gap: 31 — ABNORMAL HIGH (ref 5–15)
BUN: 171 mg/dL — ABNORMAL HIGH (ref 8–23)
CO2: 26 mmol/L (ref 22–32)
Calcium: 5.7 mg/dL — CL (ref 8.9–10.3)
Chloride: 83 mmol/L — ABNORMAL LOW (ref 98–111)
Creatinine, Ser: 7.45 mg/dL — ABNORMAL HIGH (ref 0.61–1.24)
GFR calc Af Amer: 8 mL/min — ABNORMAL LOW (ref >60)
GFR calc non Af Amer: 7 mL/min — ABNORMAL LOW (ref >60)
Glucose, Bld: 120 mg/dL — ABNORMAL HIGH (ref 70–99)
Phosphorus: >30 mg/dL — ABNORMAL HIGH (ref 2.5–4.6)
Potassium: 3.3 mmol/L — ABNORMAL LOW (ref 3.5–5.1)
SODIUM: 140 mmol/L (ref 135–145)

## 2018-10-19 LAB — HEPATITIS B SURFACE ANTIBODY,QUALITATIVE: Hep B S Ab: NONREACTIVE

## 2018-10-19 LAB — CBC
HCT: 30.1 % — ABNORMAL LOW (ref 39.0–52.0)
HCT: 26.6 % — ABNORMAL LOW (ref 39.0–52.0)
Hemoglobin: 9.9 g/dL — ABNORMAL LOW (ref 13.0–17.0)
Hemoglobin: 9 g/dL — ABNORMAL LOW (ref 13.0–17.0)
MCH: 27.2 pg (ref 26.0–34.0)
MCH: 28.4 pg (ref 26.0–34.0)
MCHC: 32.9 g/dL (ref 30.0–36.0)
MCHC: 33.8 g/dL (ref 30.0–36.0)
MCV: 82.7 fL (ref 80.0–100.0)
MCV: 83.9 fL (ref 80.0–100.0)
NRBC: 0 % (ref 0.0–0.2)
PLATELETS: 220 10*3/uL (ref 150–400)
Platelets: 247 10*3/uL (ref 150–400)
RBC: 3.64 MIL/uL — ABNORMAL LOW (ref 4.22–5.81)
RBC: 3.17 MIL/uL — ABNORMAL LOW (ref 4.22–5.81)
RDW: 15.3 % (ref 11.5–15.5)
RDW: 15.9 % — ABNORMAL HIGH (ref 11.5–15.5)
WBC: 22 10*3/uL — ABNORMAL HIGH (ref 4.0–10.5)
WBC: 21.2 10*3/uL — ABNORMAL HIGH (ref 4.0–10.5)
nRBC: 0.1 % (ref 0.0–0.2)

## 2018-10-19 LAB — CLOSTRIDIUM DIFFICILE BY PCR, REFLEXED: Toxigenic C. Difficile by PCR: POSITIVE — AB

## 2018-10-19 LAB — MRSA PCR SCREENING: MRSA by PCR: POSITIVE — AB

## 2018-10-19 LAB — HEPATITIS B CORE ANTIBODY, TOTAL: Hep B Core Total Ab: POSITIVE — AB

## 2018-10-19 MED ORDER — PIPERACILLIN-TAZOBACTAM IN DEX 2-0.25 GM/50ML IV SOLN
2.2500 g | Freq: Once | INTRAVENOUS | Status: AC
  Administered 2018-10-19: 2.25 g via INTRAVENOUS
  Filled 2018-10-19: qty 50

## 2018-10-19 MED ORDER — PHENYLEPHRINE HCL-NACL 10-0.9 MG/250ML-% IV SOLN
0.0000 ug/min | INTRAVENOUS | Status: DC
  Administered 2018-10-19: 20 ug/min via INTRAVENOUS
  Administered 2018-10-19 – 2018-10-20 (×2): 35 ug/min via INTRAVENOUS
  Administered 2018-10-20: 40 ug/min via INTRAVENOUS
  Administered 2018-10-20: 30 ug/min via INTRAVENOUS
  Administered 2018-10-20 (×2): 40 ug/min via INTRAVENOUS
  Administered 2018-10-21 (×2): 100 ug/min via INTRAVENOUS
  Administered 2018-10-21: 50 ug/min via INTRAVENOUS
  Administered 2018-10-21: 100 ug/min via INTRAVENOUS
  Administered 2018-10-21: 80 ug/min via INTRAVENOUS
  Filled 2018-10-19 (×12): qty 250

## 2018-10-19 MED ORDER — IPRATROPIUM-ALBUTEROL 0.5-2.5 (3) MG/3ML IN SOLN
3.0000 mL | Freq: Four times a day (QID) | RESPIRATORY_TRACT | Status: DC
  Administered 2018-10-19 – 2018-10-20 (×4): 3 mL via RESPIRATORY_TRACT
  Filled 2018-10-19 (×4): qty 3

## 2018-10-19 MED ORDER — PIPERACILLIN-TAZOBACTAM 3.375 G IVPB 30 MIN
3.3750 g | Freq: Four times a day (QID) | INTRAVENOUS | Status: DC
  Administered 2018-10-19 – 2018-10-20 (×4): 3.375 g via INTRAVENOUS
  Filled 2018-10-19 (×4): qty 50

## 2018-10-19 MED ORDER — PRISMASOL BGK 4/2.5 32-4-2.5 MEQ/L IV SOLN
INTRAVENOUS | Status: DC
  Administered 2018-10-19 – 2018-10-22 (×18): via INTRAVENOUS_CENTRAL
  Filled 2018-10-19 (×25): qty 5000

## 2018-10-19 MED ORDER — SODIUM CHLORIDE 0.9 % IV BOLUS
500.0000 mL | Freq: Once | INTRAVENOUS | Status: AC
  Administered 2018-10-19: 500 mL via INTRAVENOUS

## 2018-10-19 MED ORDER — PRISMASOL BGK 4/2.5 32-4-2.5 MEQ/L REPLACEMENT SOLN
Status: DC
  Administered 2018-10-19 – 2018-10-21 (×3): via INTRAVENOUS_CENTRAL
  Filled 2018-10-19 (×5): qty 5000

## 2018-10-19 MED ORDER — VANCOMYCIN 50 MG/ML ORAL SOLUTION
125.0000 mg | Freq: Four times a day (QID) | ORAL | Status: DC
  Filled 2018-10-19 (×8): qty 2.5

## 2018-10-19 MED ORDER — CHLORHEXIDINE GLUCONATE 0.12 % MT SOLN
15.0000 mL | Freq: Two times a day (BID) | OROMUCOSAL | Status: DC
  Administered 2018-10-19 – 2018-10-22 (×7): 15 mL via OROMUCOSAL
  Filled 2018-10-19 (×3): qty 15

## 2018-10-19 MED ORDER — HEPARIN SODIUM (PORCINE) 1000 UNIT/ML DIALYSIS
1000.0000 [IU] | INTRAMUSCULAR | Status: DC | PRN
  Filled 2018-10-19: qty 6

## 2018-10-19 MED ORDER — HEPARIN (PORCINE) 2000 UNITS/L FOR CRRT
INTRAVENOUS_CENTRAL | Status: DC | PRN
  Filled 2018-10-19: qty 1000

## 2018-10-19 MED ORDER — SODIUM CHLORIDE 0.9 % IV BOLUS
250.0000 mL | Freq: Once | INTRAVENOUS | Status: AC
  Administered 2018-10-19: 250 mL via INTRAVENOUS

## 2018-10-19 MED ORDER — IPRATROPIUM-ALBUTEROL 0.5-2.5 (3) MG/3ML IN SOLN: 3.0000 mL | RESPIRATORY_TRACT | Status: DC | PRN

## 2018-10-19 MED ORDER — SODIUM CHLORIDE 0.9 % IV SOLN
1.0000 g | Freq: Once | INTRAVENOUS | Status: AC
  Administered 2018-10-19: 1 g via INTRAVENOUS
  Filled 2018-10-19: qty 10

## 2018-10-19 MED ORDER — ALBUMIN HUMAN 25 % IV SOLN: 50.0000 g | Freq: Once | INTRAVENOUS | Status: DC

## 2018-10-19 MED ORDER — DEXMEDETOMIDINE HCL IN NACL 400 MCG/100ML IV SOLN
0.4000 ug/kg/h | INTRAVENOUS | Status: DC
  Administered 2018-10-19: 1 ug/kg/h via INTRAVENOUS
  Administered 2018-10-19: 0.4 ug/kg/h via INTRAVENOUS
  Administered 2018-10-19: 0.998 ug/kg/h via INTRAVENOUS
  Administered 2018-10-20: 1.2 ug/kg/h via INTRAVENOUS
  Administered 2018-10-20 (×2): 1.002 ug/kg/h via INTRAVENOUS
  Administered 2018-10-20: 1.199 ug/kg/h via INTRAVENOUS
  Administered 2018-10-20: 0.7 ug/kg/h via INTRAVENOUS
  Administered 2018-10-20: 1 ug/kg/h via INTRAVENOUS
  Administered 2018-10-20: 0.35 ug/kg/h via INTRAVENOUS
  Administered 2018-10-21: 1.5 ug/kg/h via INTRAVENOUS
  Administered 2018-10-21: 1.2 ug/kg/h via INTRAVENOUS
  Administered 2018-10-21: 2 ug/kg/h via INTRAVENOUS
  Administered 2018-10-21: 1.2 ug/kg/h via INTRAVENOUS
  Administered 2018-10-21 (×2): 1.5 ug/kg/h via INTRAVENOUS
  Administered 2018-10-21: 1.6 ug/kg/h via INTRAVENOUS
  Administered 2018-10-21: 0.9 ug/kg/h via INTRAVENOUS
  Administered 2018-10-21: 1.5 ug/kg/h via INTRAVENOUS
  Administered 2018-10-22: 1.9 ug/kg/h via INTRAVENOUS
  Administered 2018-10-22: 1.8 ug/kg/h via INTRAVENOUS
  Administered 2018-10-22: 2 ug/kg/h via INTRAVENOUS
  Administered 2018-10-22: 1.9 ug/kg/h via INTRAVENOUS
  Filled 2018-10-19 (×7): qty 100
  Filled 2018-10-19: qty 200
  Filled 2018-10-19 (×12): qty 100

## 2018-10-19 MED ORDER — ALBUMIN HUMAN 25 % IV SOLN
50.0000 g | Freq: Once | INTRAVENOUS | Status: AC
  Administered 2018-10-19: 50 g via INTRAVENOUS
  Filled 2018-10-19: qty 200
  Filled 2018-10-19: qty 50

## 2018-10-19 MED ORDER — MIDAZOLAM HCL 2 MG/2ML IJ SOLN
2.0000 mg | Freq: Once | INTRAMUSCULAR | Status: AC
  Administered 2018-10-19: 2 mg via INTRAVENOUS
  Filled 2018-10-19: qty 4

## 2018-10-19 MED ORDER — ORAL CARE MOUTH RINSE
15.0000 mL | Freq: Two times a day (BID) | OROMUCOSAL | Status: DC
  Administered 2018-10-19 – 2018-10-21 (×6): 15 mL via OROMUCOSAL

## 2018-10-19 MED ORDER — PRISMASOL BGK 4/2.5 32-4-2.5 MEQ/L REPLACEMENT SOLN
Status: DC
  Administered 2018-10-19 – 2018-10-22 (×6): via INTRAVENOUS_CENTRAL
  Filled 2018-10-19 (×9): qty 5000

## 2018-10-19 MED ORDER — POTASSIUM CHLORIDE 10 MEQ/50ML IV SOLN
10.0000 meq | INTRAVENOUS | Status: AC
  Administered 2018-10-19 – 2018-10-20 (×6): 10 meq via INTRAVENOUS
  Filled 2018-10-19 (×6): qty 50

## 2018-10-19 NOTE — Progress Notes (Signed)
PCCM Interval Note  Labile BP. He will still wake up when stimulated.  Remains encephalopathic.   L HD catheter OK by CXR  Do not believe he will tolerate IHD, will need CVVHD. Suspect he will also need pressors to facilitate. I will place orders for phenylephrine now.   Baltazar Apo, MD, PhD 10/19/2018, 4:05 PM [facility] [phone] or if no answer [phone]

## 2018-10-19 NOTE — Procedures (Signed)
Central Venous Catheter Insertion Procedure Note Javier Ho [id number] January 06, 1956  Procedure: Insertion of Central Venous Catheter Indications: Dialysis  Procedure Details Consent: Risks of procedure as well as the alternatives and risks of each were explained to the (patient/caregiver).  Consent for procedure obtained. Time Out: Verified patient identification, verified procedure, site/side was marked, verified correct patient position, special equipment/implants available, medications/allergies/relevent history reviewed, required imaging and test results available.  Performed  Maximum sterile technique was used including antiseptics, cap, gloves, gown, hand hygiene, mask and sheet. Skin prep: Chlorhexidine; local anesthetic administered  Pt required initiation low dose precedex, versed 26m x 1  A antimicrobial bonded/coated triple lumen HD catheter was placed in the left internal jugular vein using the Seldinger technique.  Evaluation Blood flow good Complications: No apparent complications Patient did tolerate procedure well. Chest X-ray ordered to verify placement.  CXR: pending.   RBaltazar Apo MD, PhD 10/19/2018, 2:17 PM [facility] [phone] if no answer [phone]

## 2018-10-19 NOTE — Progress Notes (Signed)
Patient ID: Javier Craw., male   DOB: May 14, 1956, 63 y.o.   MRN: [id number] [facility] Progress Note   Assessment/ Plan:   1.  Acute kidney injury on chronic kidney disease stage IV:  Renal function continues to worsen with marginal urine output.  Unfortunately, hemodialysis could not be done yesterday after he pulled out his dialysis catheter in a state of confusion.  The justification of dialysis to try and see if we can improve his encephalopathy that is likely multifactorial and get a better sense of direction with future management.  With inability to reach any family members, suspect that this trial of dialysis might last a week or so. 2.  Anion gap metabolic acidosis: Secondary to acute kidney injury/ketosis-corrected with isotonic bicarbonate. 3.  Encephalopathy: Likely metabolic encephalopathy with multiple incriminating etiologies including acute liver injury/SIRS and possibly uremia.  Plan to start dialysis today after hemodialysis catheter placed. 4.    Awaited LFTs/bilirubin: No evidence of acute cholecystitis requiring surgical intervention or percutaneous cholecystostomy tube placement.  LFTs remain elevated without clear etiology. 5.  History of diastolic heart failure: Appears to be compensated-we will monitor with dialysis. 6.  Anemia: Likely anemia of chronic kidney disease versus critical illness based on current presentation.    With low iron saturation however, avoiding iron at this time with acute liver injury.   Subjective:   Transferred to ICU this morning for conscious sedation and placement of a dialysis catheter for delivery of dialysis after overnight events.   Objective:   BP (!) 78/40   Pulse 72   Temp (!) 97.3 F (36.3 C) (Axillary)   Resp 15   Ht 6' 1"  (1.854 m)   Wt 105.4 kg   SpO2 94%   BMI 30.66 kg/m   Intake/Output Summary (Last 24 hours) at 10/19/2018 1127 Last data filed at 10/19/2018 0900 Gross per 24 hour  Intake 1290.29 ml   Output 600 ml  Net 690.29 ml   Weight change: -0.4 kg  Physical Exam: Gen: Resting comfortably in bed, on sedation.  Dry mucosal membranes Robidone CVS: Pulse regular rhythm, normal rate, S1 and S2 normal Resp: Coarse/transmitted breath sounds bilaterally. Abd: Soft, obese, nontender Ext: No lower extremity edema  Imaging: Ir US Guide Vasc Access Right  Result Date: 10/18/2018 INDICATION: 63 year old with acute on chronic renal failure. Encephalopathy. Request for non tunneled dialysis catheter. EXAM: FLUOROSCOPIC AND ULTRASOUND GUIDED PLACEMENT OF A NON-TUNNELED DIALYSIS CATHETER Physician: Stephan Minister. Henn, MD MEDICATIONS: None ANESTHESIA/SEDATION: Ativan 0.5 mg prior to the procedure FLUOROSCOPY TIME:  Fluoroscopy Time:  minutes  seconds ( mGy). COMPLICATIONS: None immediate. PROCEDURE: Patient is encephalopathic and can not give informed consent. There is no family or POA available for consent. Therefore, emergency consent was performed. Patient was combative and unable to perform the procedure on the fluoroscopic table. Procedure was performed in the patient's bed. Ultrasound confirmed a patent right internal jugular vein. Ultrasound images were obtained for documentation. The right side of the neck was prepped and draped in a sterile fashion. The right side of the neck was anesthetized with 1% lidocaine. Maximal barrier sterile technique was utilized including caps, mask, sterile gowns, sterile gloves, sterile drape, hand hygiene and skin antiseptic. A small incision was made with #11 blade scalpel. A 21 gauge needle directed into the right internal jugular vein with ultrasound guidance. A micropuncture dilator set was placed. A 16 cm Mahurkar catheter was selected. The catheter was advanced easily over the stiff Amplatz wire. Both  dialysis lumens were found to aspirate and flush well. The proper amount of heparin was flushed in both lumens. The central venous lumen was flushed with normal saline.  Catheter was sutured to skin. Dressing was placed over the catheter. Follow-up portable chest x-ray demonstrated that the central line was in the lower SVC and appropriately positioned. FINDINGS: Catheter tip in the lower SVC based on follow-up chest radiograph. IMPRESSION: Successful placement of a right jugular non-tunneled dialysis catheter using ultrasound guidance. Electronically Signed   By: Markus Daft M.D.   On: 10/18/2018 12:52   Dg Chest Port 1 View  Result Date: 10/18/2018 CLINICAL DATA:  Shortness of breath after right internal jugular dialysis catheter placement. EXAM: PORTABLE CHEST 1 VIEW COMPARISON:  Chest x-ray dated October 14, 2018. FINDINGS: Interval placement of a right internal jugular dialysis catheter with the tip in the proximal right atrium. Stable cardiomediastinal silhouette. Normal pulmonary vascularity. Unchanged low lung volumes with mild bibasilar atelectasis. Unchanged elevation of the left hemidiaphragm. No focal consolidation, pleural effusion, or pneumothorax. No acute osseous abnormality. IMPRESSION: 1. No active disease. 2. Appropriately positioned right internal jugular dialysis catheter. Electronically Signed   By: Titus Dubin M.D.   On: 10/18/2018 12:41    Labs: BMET Recent Labs  Lab 10/03/2018 1957 10/15/2018 2225 10/15/18 0723 10/16/18 0419 10/17/18 0408 10/18/18 0407 10/19/18 0632  NA 130*  --  131* 135 137 140 139  K 3.7  --  4.0 3.6 3.5 3.6 3.4*  CL 90*  --  95* 92* 87* 86* 82*  CO2 20*  --  15* 21* 24 28 30   GLUCOSE 224*  --  168* 145* 168* 159* 174*  BUN 114*  --  121* 131* 141* 157* 172*  CREATININE 6.12*  --  6.10* 6.45* 6.74* 7.29* 7.74*  CALCIUM 7.3*  --  6.9* 6.4* 5.8* 6.0* 5.7*  PHOS  --  7.1*  --  9.7*  --   --   --    CBC Recent Labs  Lab 10/06/2018 1957 10/15/18 0723  10/16/18 0419 10/17/18 0408 10/18/18 0407 10/19/18 0632  WBC 15.9* 14.8*  --  16.9* 18.5* 20.0* 22.0*  NEUTROABS 14.5* 13.5*  --   --   --   --   --   HGB 9.7*  9.2*  --  9.3* 9.0* 9.9* 9.9*  HCT 30.4* 28.1*  --  27.6* 26.6* 29.2* 30.1*  MCV 86.9 85.2  --  83.6 81.3 82.5 82.7  PLT 233 210   < > 235 250 266 247   < > = values in this interval not displayed.    Medications:    . budesonide (PULMICORT) nebulizer solution  0.25 mg Nebulization BID  . chlorhexidine  15 mL Mouth Rinse BID  . Chlorhexidine Gluconate Cloth  6 each Topical Q0600  . insulin aspart  0-9 Units Subcutaneous Q4H  . ipratropium-albuterol  3 mL Nebulization QID  . mouth rinse  15 mL Mouth Rinse q12n4p  . midazolam  2-4 mg Intravenous Once  . vancomycin  125 mg Oral QID   Elmarie Shiley, MD 10/19/2018, 11:27 AM

## 2018-10-19 NOTE — Progress Notes (Signed)
Subjective: The patient pulled out his right IJ catheter.  Objective: Vital signs in last 24 hours: Temp:  [97 F (36.1 C)-98.2 F (36.8 C)] 97.3 F (36.3 C) (01/25 0500) Pulse Rate:  [75-96] 75 (01/25 0500) Resp:  [12-24] 16 (01/25 0500) BP: (87-124)/(33-102) 103/73 (01/25 0500) SpO2:  [95 %-100 %] 100 % (01/25 0500) Weight:  [105.4 kg] 105.4 kg (01/25 0500) Last BM Date: 10/18/18  Intake/Output from previous day: 01/24 0701 - 01/25 0700 In: 0  Out: 600 [Urine:600] Intake/Output this shift: No intake/output data recorded.  General appearance: severe distress GI: soft, non-tender; bowel sounds normal; no masses,  no organomegaly  Lab Results: Recent Labs    10/17/18 0408 10/18/18 0407 10/19/18 0632  WBC 18.5* 20.0* 22.0*  HGB 9.0* 9.9* 9.9*  HCT 26.6* 29.2* 30.1*  PLT 250 266 247   BMET Recent Labs    10/17/18 0408 10/18/18 0407  NA 137 140  K 3.5 3.6  CL 87* 86*  CO2 24 28  GLUCOSE 168* 159*  BUN 141* 157*  CREATININE 6.74* 7.29*  CALCIUM 5.8* 6.0*   LFT Recent Labs    10/18/18 0407  PROT 6.7  ALBUMIN 2.1*  AST 2,532*  ALT 321*  ALKPHOS 1,716*  BILITOT 10.0*   PT/INR Recent Labs    10/17/18 0938  LABPROT 18.5*  INR 1.56   Hepatitis Panel No results for input(s): HEPBSAG, HCVAB, HEPAIGM, HEPBIGM in the last 72 hours. C-Diff No results for input(s): CDIFFTOX in the last 72 hours. Fecal Lactopherrin No results for input(s): FECLLACTOFRN in the last 72 hours.  Studies/Results: Ir US Guide Vasc Access Right  Result Date: 10/18/2018 INDICATION: 63 year old with acute on chronic renal failure. Encephalopathy. Request for non tunneled dialysis catheter. EXAM: FLUOROSCOPIC AND ULTRASOUND GUIDED PLACEMENT OF A NON-TUNNELED DIALYSIS CATHETER Physician: Stephan Minister. Henn, MD MEDICATIONS: None ANESTHESIA/SEDATION: Ativan 0.5 mg prior to the procedure FLUOROSCOPY TIME:  Fluoroscopy Time:  minutes  seconds ( mGy). COMPLICATIONS: None immediate. PROCEDURE:  Patient is encephalopathic and can not give informed consent. There is no family or POA available for consent. Therefore, emergency consent was performed. Patient was combative and unable to perform the procedure on the fluoroscopic table. Procedure was performed in the patient's bed. Ultrasound confirmed a patent right internal jugular vein. Ultrasound images were obtained for documentation. The right side of the neck was prepped and draped in a sterile fashion. The right side of the neck was anesthetized with 1% lidocaine. Maximal barrier sterile technique was utilized including caps, mask, sterile gowns, sterile gloves, sterile drape, hand hygiene and skin antiseptic. A small incision was made with #11 blade scalpel. A 21 gauge needle directed into the right internal jugular vein with ultrasound guidance. A micropuncture dilator set was placed. A 16 cm Mahurkar catheter was selected. The catheter was advanced easily over the stiff Amplatz wire. Both dialysis lumens were found to aspirate and flush well. The proper amount of heparin was flushed in both lumens. The central venous lumen was flushed with normal saline. Catheter was sutured to skin. Dressing was placed over the catheter. Follow-up portable chest x-ray demonstrated that the central line was in the lower SVC and appropriately positioned. FINDINGS: Catheter tip in the lower SVC based on follow-up chest radiograph. IMPRESSION: Successful placement of a right jugular non-tunneled dialysis catheter using ultrasound guidance. Electronically Signed   By: Markus Daft M.D.   On: 10/18/2018 12:52   Dg Chest Port 1 View  Result Date: 10/18/2018 CLINICAL DATA:  Shortness  of breath after right internal jugular dialysis catheter placement. EXAM: PORTABLE CHEST 1 VIEW COMPARISON:  Chest x-ray dated October 14, 2018. FINDINGS: Interval placement of a right internal jugular dialysis catheter with the tip in the proximal right atrium. Stable cardiomediastinal  silhouette. Normal pulmonary vascularity. Unchanged low lung volumes with mild bibasilar atelectasis. Unchanged elevation of the left hemidiaphragm. No focal consolidation, pleural effusion, or pneumothorax. No acute osseous abnormality. IMPRESSION: 1. No active disease. 2. Appropriately positioned right internal jugular dialysis catheter. Electronically Signed   By: Titus Dubin M.D.   On: 10/18/2018 12:41   US Liver Doppler  Result Date: 10/17/2018 CLINICAL DATA:  Elevated liver function tests EXAM: DUPLEX ULTRASOUND OF LIVER TECHNIQUE: Color and duplex Doppler ultrasound was performed to evaluate the hepatic in-flow and out-flow vessels. COMPARISON:  None. FINDINGS: Portal Vein Velocities Main:  65 cm/sec Right:  43 cm/sec Left:  36 cm/sec Hepatic Vein Velocities Right:  65 cm/sec Middle:  64 cm/sec Left:  64 cm/sec Hepatic Artery Velocity:  90 cm/sec Splenic Vein Velocity:  Not visualized Varices: Absent Ascites: Trace ascites is present. The liver is diffusely heterogeneous with a nodular contour. This is compatible with cirrhotic change throughout the liver. Hepatic veins are patent with hepatofugal flow. The portal vein and its branches are hepatopetal inflow. The spleen is within normal limits in size. The splenic vein could not be clearly identified it has patency can not be confirmed by this study. IMPRESSION: Hepatic and portal veins are patent with normal directionality of flow. The splenic vein was not visualized. It has patency can not be confirmed by this study. Cirrhotic liver. Trace ascites. Electronically Signed   By: Marybelle Killings M.D.   On: 10/17/2018 09:34    Medications:  Scheduled: . budesonide (PULMICORT) nebulizer solution  0.25 mg Nebulization BID  . Chlorhexidine Gluconate Cloth  6 each Topical Q0600  . insulin aspart  0-9 Units Subcutaneous Q4H  . ipratropium-albuterol  3 mL Nebulization BID  . LORazepam  1 mg Intravenous Once   Continuous: . sodium chloride    . sodium  chloride    . piperacillin-tazobactam (ZOSYN)  IV 3.375 g (10/18/18 2140)  .  sodium bicarbonate (isotonic) infusion in sterile water Stopped (10/18/18 1341)    Assessment/Plan: 1) Severely agitated. 2) Cirrhosis. 3) Renal failure. 4) Markedly elevated AP.   The patient does know his current location, but he is extremely agitated.  This is a very confusing and concerning presentation as there is very little history.  I spoke with Dr. Cruzita Lederer and I agree with a CCM consult.  In fact, Dr. Lamonte Sakai has evaluated the patient and the plan is to transfer him to the unit.  He does need dialysis.  As for his gallbladder, I discussed the situation with Dr. Anselm Pancoast yesterday and there does not appear to be an acute cholecystitis in the setting of his liver disease.  Dr. Cruzita Lederer is planning on checking for C. Diff with his rising WBC.  Plan: 1) Continue with antibiotics. 2) Agree with transfer to ICU.  LOS: 5 days   Absalom Aro D 10/19/2018, 7:47 AM

## 2018-10-19 NOTE — Progress Notes (Signed)
PCCM Brief Note  Discussed patient's case w DR Cruzita Lederer this am, notes reviewed. In brief he has encephalopathy and intermittent agitation due to acute on chronic renal and hepatic failure.   He was very agitated when HD cath was placed 1/24, calmed some when he returned to room but ultimately pulled it out  Vitals:   10/19/18 0416 10/19/18 0500  BP: 101/63 103/73  Pulse:  75  Resp: 16 16  Temp:  (!) 97.3 F (36.3 C)  SpO2: 97% 100%  On my eval today he wakes to voice, says he wants to go home. He is oriented to self and place, will answer questions. Speech is a bit slurred. He is trying to get mittens off his hands. Lungs are clear. Heart distant but regular. Abdomen is protuberant, tender to palp diffusely, no rebound. 1+ LE edema. Skin is jaundiced.   Apparently there is no HCPOA. He has a friend listed as a contact - Dr Cruzita Lederer was unable to have a good conversation with them as they appeared to be intoxicated.  If we are going to attempt to address his metabolic encephalopathy, he will need HD as Renal has recommended. I don't believe I can safely place an HD catheter with him on the floor. He will need to go to ICU, probably for precedex +/- conscious sedation.  I will write transfer orders now.  Full consult to follow.   Baltazar Apo, MD, PhD 10/19/2018, 7:41 AM [facility] [phone] or if no answer [phone]

## 2018-10-19 NOTE — Progress Notes (Signed)
Pt transferred to 2M10. All belongings sent with pt. Carroll Kinds RN

## 2018-10-19 NOTE — Progress Notes (Signed)
CRITICAL VALUE ALERT  Critical Value:  Ca = 5.7  Date & Time Notied:  10/19/18 @ 2028  Provider Notified: Dr Johnette Abraham Deterding  Orders Received/Actions taken: orders noted
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
## 2018-10-19 NOTE — Progress Notes (Signed)
PROGRESS NOTE  Javier Ho. WJX:[id number] DOB: [birth year] DOA: 10/21/2018 PCP: Deloria Lair., MD   LOS: 5 days   Brief Narrative / Interim history: 63 y.o.malewithhistory of COPD, diastolic CHF, chronic kidney disease stage IV, chronic anemia, diabetes mellitus type 2, anxiety and depression admitted with weakness and multiple falls the last 1 week. On admission, found to have markedly elevated liver enzymes, worsening renal function with uremia and nonspecific EKG changes with multiple PVCs.  CT head without acute intracranial finding.  Portable chest x-ray without acute cardiopulmonary finding.  CT renal stone revealed distended gallbladder and severe constipation possible fecal impaction.  HIDA positive.  Patient could not tolerate MRCP. General surgery, GI and nephrology consulted.  Subjective: -He is alert this morning however quite confused.  Has the mittens on.  Underwent dialysis catheter placement yesterday with difficulties, requiring Ativan for agitation, eventually was successful but upon returning to the room became agitated and impulsive and pulled his catheter out.  Does not recall anything this morning and the only complaint he has is that he is thirsty  Assessment & Plan: Active Problems:   COPD (chronic obstructive pulmonary disease) (HCC)   Chronic use of opiate for therapeutic purpose   Diabetes mellitus with nephropathy ([place])   ARF (acute renal failure) (HCC)   Urinary retention   Lower urinary tract infectious disease   Hepatitis   Frequent falls   Bronchitis   Acute renal failure superimposed on chronic kidney disease (HCC)   Principal Problem Markedly elevated liver enzymes/hyperbilirubinemia/alkaline phosphatase: concern for cholecystitis/biliary obstruction  -General surgery consulted, does not appear that gallbladder is an issue even with positive HIDA scan, suspect abnormal HIDA scan because of hepatocellular issues -LFTs continued to get worse,  GI was consulted, Dr. Benson Norway following, liver Doppler ultrasound negative for thrombus but did show cirrhotic liver -Discussed with patient's friend over the phone who lives with the patient, no reports of abusing Percocet or any other toxins/drugs.  Tylenol level was negative on admission -Interventional radiology was also consulted to evaluate for percutaneous cholecystostomy, however general consensus after discussing with them, GI as well as general surgery that all gallbladder imaging findings are related to his liver disease.  Active Problems AKI on CKD/uremia/hyperphosphatemia  -CT renal stone study did not reveal any obstructive etiology.  Only on low-dose aspirin and Lasix at home. -Neurology following -Renal function continues to deteriorate now complicated by uremic encephalopathy.  Hemodialysis catheter was successfully placed yesterday although with significant difficulties however on arrival to the floor patient pulled his catheter out.  I have consulted critical care for ICU transfer and evaluation for Precedex drip/conscious sedation for HD catheter and initiation of hemodialysis.  He will be transferred to the ICU  Leukocytosis/diarrhea -Has progressive leukocytosis and significant diarrhea requiring rectal tube placement.  We will send a C. difficile  Acute toxic metabolic encephalopathy /uremic encephalopathy -Mental status is fluctuating, but remains confused.  Ammonia was repeated on 1/24 and it was normal -Transferred to the ICU  Anion gap metabolic acidosis -Likely due to uremia and renal failure as above -Needs dialysis at this point  Chronic diastolic CHF -EF 55 to 21% with G1 DD.  No signs of fluid overload on exam.  No respiratory distress. -Continue to monitor  Multiple falls -Could be due to the above.  CT head without acute intracranial finding.  DM2  -Last A1c 7.9% in 2018.  On 50 units of Lantus at home. -Continue sliding scale, CBGs controlled, now  n.p.o.  due to encephalopathy  COPD -No wheezing, stable -Continue breathing treatments.  Multiple PVCs -Likely due to electrolyte issues with renal failure.  Goals of care -Patient quite confused, tried calling all contact numbers was finally able to get in touch with his friend who is aware of patient's hospitalization as she was the one that brought him to the hospital however patient does not have a POA or any family around.  Discussion with friend over the phone was quite difficult on 1/23 as she with repeatedly fall asleep during our conversation   Scheduled Meds: . budesonide (PULMICORT) nebulizer solution  0.25 mg Nebulization BID  . Chlorhexidine Gluconate Cloth  6 each Topical Q0600  . insulin aspart  0-9 Units Subcutaneous Q4H  . ipratropium-albuterol  3 mL Nebulization QID   Continuous Infusions: . sodium chloride    . sodium chloride    . piperacillin-tazobactam (ZOSYN)  IV Stopped (10/19/18 0321)  .  sodium bicarbonate (isotonic) infusion in sterile water Stopped (10/18/18 1340)   PRN Meds:.sodium chloride, sodium chloride, alteplase, heparin, heparin, hydrALAZINE, ipratropium-albuterol, lidocaine (PF), lidocaine (PF), lidocaine-prilocaine, ondansetron **OR** ondansetron (ZOFRAN) IV, pentafluoroprop-tetrafluoroeth  DVT prophylaxis: SCDs Code Status: Full code Family Communication: no family at bedside  Disposition Plan: Transfer to the ICU  Consultants:   GI  Surgery  Nephrology   Procedures:   2D echo Impressions: - Technically difficult study. LVEF 65-70%, normal wall thickness, normal wall motion, grade 1 DD, indeterminate LV filling pressure, normal LA size, mild RAE  Antimicrobials:  Zosyn 1/20 >>   Objective: Vitals:   10/19/18 0500 10/19/18 0753 10/19/18 0800 10/19/18 0900  BP: 103/73   94/61  Pulse: 75 73  73  Resp: 16 18 17  (!) 23  Temp: (!) 97.3 F (36.3 C)     TempSrc: Axillary     SpO2: 100% 96%  96%  Weight: 105.4 kg     Height:         Intake/Output Summary (Last 24 hours) at 10/19/2018 2248 Last data filed at 10/19/2018 0900 Gross per 24 hour  Intake 1290.29 ml  Output 600 ml  Net 690.29 ml   Filed Weights   10/17/18 0445 10/18/18 0545 10/19/18 0500  Weight: 105.6 kg 105.8 kg 105.4 kg    Examination:  Constitutional: Appears icteric, alert but confused, mittens on Eyes: Scleral icterus present ENMT: Dry mucous membranes Neck: normal, supple Respiratory: No wheezing or crackles heard, shallow inspirations but clear on anterior auscultation Cardiovascular: Regular rate and rhythm, no edema Abdomen: Slightly distended, no tenderness on palpation, bowel sounds positive Musculoskeletal: no clubbing / cyanosis.  Skin: No rashes, icteric Neurologic: No focal deficits, equal strength and moves all 4 extremities.  Does not follow commands consistently.  Data Reviewed: I have independently reviewed following labs and imaging studies   CBC: Recent Labs  Lab 10/21/2018 1957 10/15/18 0723 10/15/18 1358 10/16/18 0419 10/17/18 0408 10/18/18 0407 10/19/18 0632  WBC 15.9* 14.8*  --  16.9* 18.5* 20.0* 22.0*  NEUTROABS 14.5* 13.5*  --   --   --   --   --   HGB 9.7* 9.2*  --  9.3* 9.0* 9.9* 9.9*  HCT 30.4* 28.1*  --  27.6* 26.6* 29.2* 30.1*  MCV 86.9 85.2  --  83.6 81.3 82.5 82.7  PLT 233 210 212 235 250 266 250   Basic Metabolic Panel: Recent Labs  Lab 10/09/2018 2225 10/15/18 0723 10/16/18 0419 10/17/18 0408 10/18/18 0407 10/19/18 0632  NA  --  131* 135 137 140  139  K  --  4.0 3.6 3.5 3.6 3.4*  CL  --  95* 92* 87* 86* 82*  CO2  --  15* 21* 24 28 30   GLUCOSE  --  168* 145* 168* 159* 174*  BUN  --  121* 131* 141* 157* 172*  CREATININE  --  6.10* 6.45* 6.74* 7.29* 7.74*  CALCIUM  --  6.9* 6.4* 5.8* 6.0* 5.7*  MG 2.6*  --   --   --   --   --   PHOS 7.1*  --  9.7*  --   --   --    GFR: Estimated Creatinine Clearance: 12.3 mL/min (A) (by C-G formula based on SCr of 7.74 mg/dL (H)). Liver Function  Tests: Recent Labs  Lab 10/15/18 0723 10/16/18 0419 10/17/18 0408 10/17/18 2130 10/18/18 0407 10/19/18 0632  AST 1,475* 2,002* 2,623*  --  2,532* 2,564*  ALT 737* 854* 741* 488* 321* 275*  ALKPHOS 890* 1,331* 1,390*  --  1,716* 1,893*  BILITOT 5.6* 7.2* 8.6*  --  10.0* 10.9*  PROT 6.4* 6.9 6.5  --  6.7 6.4*  ALBUMIN 2.3* 2.3* 2.1*  --  2.1* 1.9*   No results for input(s): LIPASE, AMYLASE in the last 168 hours. Recent Labs  Lab 10/22/2018 2225 10/18/18 2006  AMMONIA 23 34   Coagulation Profile: Recent Labs  Lab 10/08/2018 1957 10/15/18 1358 10/17/18 0938  INR 1.29 1.34 1.56   Cardiac Enzymes: Recent Labs  Lab 09/29/2018 2225 10/15/18 0723 10/15/18 1705 10/15/18 1933  CKTOTAL 56 58  --   --   CKMB  --  1.9  --   --   TROPONINI  --  0.06* 0.05* 0.04*   BNP (last 3 results) No results for input(s): PROBNP in the last 8760 hours. HbA1C: No results for input(s): HGBA1C in the last 72 hours. CBG: Recent Labs  Lab 10/18/18 2118 10/19/18 0023 10/19/18 0438 10/19/18 0734 10/19/18 0857  GLUCAP 166* 172* 162* 166* 151*   Lipid Profile: No results for input(s): CHOL, HDL, LDLCALC, TRIG, CHOLHDL, LDLDIRECT in the last 72 hours. Thyroid Function Tests: No results for input(s): TSH, T4TOTAL, FREET4, T3FREE, THYROIDAB in the last 72 hours. Anemia Panel: No results for input(s): VITAMINB12, FOLATE, FERRITIN, TIBC, IRON, RETICCTPCT in the last 72 hours. Urine analysis:    Component Value Date/Time   COLORURINE AMBER (A) 09/29/2018 2250   APPEARANCEUR HAZY (A) 10/04/2018 2250   LABSPEC 1.011 10/09/2018 2250   PHURINE 5.0 10/17/2018 2250   GLUCOSEU NEGATIVE 10/03/2018 2250   HGBUR SMALL (A) 10/04/2018 2250   BILIRUBINUR NEGATIVE 10/17/2018 2250   KETONESUR NEGATIVE 10/01/2018 2250   PROTEINUR 30 (A) 10/10/2018 2250   NITRITE NEGATIVE 10/01/2018 2250   LEUKOCYTESUR NEGATIVE 10/13/2018 2250   Sepsis Labs: Invalid input(s): PROCALCITONIN, LACTICIDVEN  Recent Results  (from the past 240 hour(s))  Culture, blood (routine x 2)     Status: None (Preliminary result)   Collection Time: 10/10/2018 10:26 PM  Result Value Ref Range Status   Specimen Description BLOOD SITE NOT SPECIFIED  Final   Special Requests   Final    BOTTLES DRAWN AEROBIC AND ANAEROBIC Blood Culture adequate volume   Culture   Final    NO GROWTH 3 DAYS Performed at [facility], [address], [place], [place] [address]    Report Status PENDING  Incomplete  Urine culture     Status: Abnormal   Collection Time: 10/24/2018 11:34 PM  Result Value Ref Range  Status   Specimen Description URINE, CATHETERIZED  Final   Special Requests   Final    NONE Performed at [facility], [place] [address] [place], [place] [address]    Culture >=100,000 COLONIES/mL YEAST (A)  Final   Report Status 10/16/2018 FINAL  Final  Culture, blood (routine x 2)     Status: None (Preliminary result)   Collection Time: 10/15/18 12:24 AM  Result Value Ref Range Status   Specimen Description BLOOD LEFT HAND  Final   Special Requests   Final    BOTTLES DRAWN AEROBIC AND ANAEROBIC Blood Culture results may not be optimal due to an inadequate volume of blood received in culture bottles   Culture   Final    NO GROWTH 3 DAYS Performed at [facility], [place] [address] [place], [place] [address]    Report Status PENDING  Incomplete  C difficile quick scan w PCR reflex     Status: Abnormal   Collection Time: 10/19/18  6:15 AM  Result Value Ref Range Status   C Diff antigen POSITIVE (A) NEGATIVE Final   C Diff toxin NEGATIVE NEGATIVE Final   C Diff interpretation Results are indeterminate. See PCR results.  Final    Comment: Performed at [facility], [place] [address], [place], [place] [address]  C. Diff by PCR, Reflexed     Status: Abnormal   Collection Time: 10/19/18  6:15 AM  Result Value Ref Range Status   Toxigenic C. Difficile by PCR POSITIVE (A) NEGATIVE Final    Comment: Positive for  toxigenic C. difficile with little to no toxin production. Only treat if clinical presentation suggests symptomatic illness. Performed at [facility], [place] [address], [place], [place] [address]       Radiology Studies: Ir US Guide Vasc Access Right  Result Date: 10/18/2018 INDICATION: 63 year old with acute on chronic renal failure. Encephalopathy. Request for non tunneled dialysis catheter. EXAM: FLUOROSCOPIC AND ULTRASOUND GUIDED PLACEMENT OF A NON-TUNNELED DIALYSIS CATHETER Physician: Stephan Minister. Henn, MD MEDICATIONS: None ANESTHESIA/SEDATION: Ativan 0.5 mg prior to the procedure FLUOROSCOPY TIME:  Fluoroscopy Time:  minutes  seconds ( mGy). COMPLICATIONS: None immediate. PROCEDURE: Patient is encephalopathic and can not give informed consent. There is no family or POA available for consent. Therefore, emergency consent was performed. Patient was combative and unable to perform the procedure on the fluoroscopic table. Procedure was performed in the patient's bed. Ultrasound confirmed a patent right internal jugular vein. Ultrasound images were obtained for documentation. The right side of the neck was prepped and draped in a sterile fashion. The right side of the neck was anesthetized with 1% lidocaine. Maximal barrier sterile technique was utilized including caps, mask, sterile gowns, sterile gloves, sterile drape, hand hygiene and skin antiseptic. A small incision was made with #11 blade scalpel. A 21 gauge needle directed into the right internal jugular vein with ultrasound guidance. A micropuncture dilator set was placed. A 16 cm Mahurkar catheter was selected. The catheter was advanced easily over the stiff Amplatz wire. Both dialysis lumens were found to aspirate and flush well. The proper amount of heparin was flushed in both lumens. The central venous lumen was flushed with normal saline. Catheter was sutured to skin. Dressing was placed over the catheter. Follow-up portable chest x-ray  demonstrated that the central line was in the lower SVC and appropriately positioned. FINDINGS: Catheter tip in the lower SVC based on follow-up chest radiograph. IMPRESSION: Successful placement of a  right jugular non-tunneled dialysis catheter using ultrasound guidance. Electronically Signed   By: Markus Daft M.D.   On: 10/18/2018 12:52   Dg Chest Port 1 View  Result Date: 10/18/2018 CLINICAL DATA:  Shortness of breath after right internal jugular dialysis catheter placement. EXAM: PORTABLE CHEST 1 VIEW COMPARISON:  Chest x-ray dated October 14, 2018. FINDINGS: Interval placement of a right internal jugular dialysis catheter with the tip in the proximal right atrium. Stable cardiomediastinal silhouette. Normal pulmonary vascularity. Unchanged low lung volumes with mild bibasilar atelectasis. Unchanged elevation of the left hemidiaphragm. No focal consolidation, pleural effusion, or pneumothorax. No acute osseous abnormality. IMPRESSION: 1. No active disease. 2. Appropriately positioned right internal jugular dialysis catheter. Electronically Signed   By: Titus Dubin M.D.   On: 10/18/2018 12:41    Marzetta Board, MD, PhD [facility]  Contact via  [email or web address]  [place] P: [phone]  F: [phone]

## 2018-10-19 NOTE — Progress Notes (Signed)
[place] [facility] Progress Note Patient Name: Javier Ho. DOB: 02/16/1956 MRN: [id number]   Date of Service  10/19/2018  HPI/Events of Note  Hypokalemia and hypocalcemia  eICU Interventions  Potassium and calcium replaced     Intervention Category Intermediate Interventions: Electrolyte abnormality - evaluation and management  DETERDING,ELIZABETH 10/19/2018, 8:54 PM

## 2018-10-20 DIAGNOSIS — A0472 Enterocolitis due to Clostridium difficile, not specified as recurrent: Secondary | ICD-10-CM

## 2018-10-20 LAB — GLUCOSE, CAPILLARY
GLUCOSE-CAPILLARY: 128 mg/dL — AB (ref 70–99)
GLUCOSE-CAPILLARY: 97 mg/dL (ref 70–99)
Glucose-Capillary: 105 mg/dL — ABNORMAL HIGH (ref 70–99)
Glucose-Capillary: 106 mg/dL — ABNORMAL HIGH (ref 70–99)
Glucose-Capillary: 107 mg/dL — ABNORMAL HIGH (ref 70–99)
Glucose-Capillary: 107 mg/dL — ABNORMAL HIGH (ref 70–99)

## 2018-10-20 LAB — RENAL FUNCTION PANEL
ANION GAP: 14 (ref 5–15)
Albumin: 2.4 g/dL — ABNORMAL LOW (ref 3.5–5.0)
Albumin: 2.1 g/dL — ABNORMAL LOW (ref 3.5–5.0)
Anion gap: 19 — ABNORMAL HIGH (ref 5–15)
BUN: 123 mg/dL — ABNORMAL HIGH (ref 8–23)
BUN: 94 mg/dL — ABNORMAL HIGH (ref 8–23)
CO2: 26 mmol/L (ref 22–32)
CO2: 25 mmol/L (ref 22–32)
Calcium: 7 mg/dL — ABNORMAL LOW (ref 8.9–10.3)
Calcium: 7.9 mg/dL — ABNORMAL LOW (ref 8.9–10.3)
Chloride: 93 mmol/L — ABNORMAL LOW (ref 98–111)
Chloride: 98 mmol/L (ref 98–111)
Creatinine, Ser: 5.65 mg/dL — ABNORMAL HIGH (ref 0.61–1.24)
Creatinine, Ser: 4.21 mg/dL — ABNORMAL HIGH (ref 0.61–1.24)
GFR calc Af Amer: 11 mL/min — ABNORMAL LOW (ref >60)
GFR calc Af Amer: 16 mL/min — ABNORMAL LOW (ref >60)
GFR calc non Af Amer: 10 mL/min — ABNORMAL LOW (ref >60)
GFR calc non Af Amer: 14 mL/min — ABNORMAL LOW (ref >60)
Glucose, Bld: 126 mg/dL — ABNORMAL HIGH (ref 70–99)
Glucose, Bld: 109 mg/dL — ABNORMAL HIGH (ref 70–99)
Phosphorus: 8.7 mg/dL — ABNORMAL HIGH (ref 2.5–4.6)
Phosphorus: 7.3 mg/dL — ABNORMAL HIGH (ref 2.5–4.6)
Potassium: 4.1 mmol/L (ref 3.5–5.1)
Potassium: 4 mmol/L (ref 3.5–5.1)
SODIUM: 137 mmol/L (ref 135–145)
Sodium: 138 mmol/L (ref 135–145)

## 2018-10-20 LAB — CULTURE, BLOOD (ROUTINE X 2)
CULTURE: NO GROWTH
Culture: NO GROWTH
Special Requests: ADEQUATE

## 2018-10-20 LAB — HEPATIC FUNCTION PANEL
ALT: 247 U/L — ABNORMAL HIGH (ref 0–44)
AST: 2244 U/L — AB (ref 15–41)
Albumin: 2.4 g/dL — ABNORMAL LOW (ref 3.5–5.0)
Alkaline Phosphatase: 2021 U/L — ABNORMAL HIGH (ref 38–126)
Bilirubin, Direct: 9.3 mg/dL — ABNORMAL HIGH (ref 0.0–0.2)
Indirect Bilirubin: 4.8 mg/dL — ABNORMAL HIGH (ref 0.3–0.9)
Total Bilirubin: 14.1 mg/dL — ABNORMAL HIGH (ref 0.3–1.2)
Total Protein: 6.7 g/dL (ref 6.5–8.1)

## 2018-10-20 LAB — COMPREHENSIVE METABOLIC PANEL
ALBUMIN: 1.9 g/dL — AB (ref 3.5–5.0)
ALT: 275 U/L — ABNORMAL HIGH (ref 0–44)
AST: 2564 U/L — ABNORMAL HIGH (ref 15–41)
Alkaline Phosphatase: 1893 U/L — ABNORMAL HIGH (ref 38–126)
Anion gap: 27 — ABNORMAL HIGH (ref 5–15)
BUN: 172 mg/dL — ABNORMAL HIGH (ref 8–23)
CO2: 30 mmol/L (ref 22–32)
Calcium: 5.7 mg/dL — CL (ref 8.9–10.3)
Chloride: 82 mmol/L — ABNORMAL LOW (ref 98–111)
Creatinine, Ser: 7.74 mg/dL — ABNORMAL HIGH (ref 0.61–1.24)
GFR calc Af Amer: 8 mL/min — ABNORMAL LOW (ref >60)
GFR calc non Af Amer: 7 mL/min — ABNORMAL LOW (ref >60)
GLUCOSE: 174 mg/dL — AB (ref 70–99)
Potassium: 3.4 mmol/L — ABNORMAL LOW (ref 3.5–5.1)
Sodium: 139 mmol/L (ref 135–145)
Total Bilirubin: 10.9 mg/dL — ABNORMAL HIGH (ref 0.3–1.2)
Total Protein: 6.4 g/dL — ABNORMAL LOW (ref 6.5–8.1)

## 2018-10-20 LAB — POCT I-STAT 7, (LYTES, BLD GAS, ICA,H+H)
Acid-Base Excess: 3 mmol/L — ABNORMAL HIGH (ref 0.0–2.0)
Bicarbonate: 27.8 mmol/L (ref 20.0–28.0)
Calcium, Ion: 0.87 mmol/L — CL (ref 1.15–1.40)
HCT: 31 % — ABNORMAL LOW (ref 39.0–52.0)
Hemoglobin: 10.5 g/dL — ABNORMAL LOW (ref 13.0–17.0)
O2 Saturation: 97 %
Potassium: 4.1 mmol/L (ref 3.5–5.1)
Sodium: 139 mmol/L (ref 135–145)
TCO2: 29 mmol/L (ref 22–32)
pCO2 arterial: 42 mmHg (ref 32.0–48.0)
pH, Arterial: 7.429 (ref 7.350–7.450)
pO2, Arterial: 92 mmHg (ref 83.0–108.0)

## 2018-10-20 LAB — CBC
HCT: 29.5 % — ABNORMAL LOW (ref 39.0–52.0)
HEMOGLOBIN: 10 g/dL — AB (ref 13.0–17.0)
MCH: 28.7 pg (ref 26.0–34.0)
MCHC: 33.9 g/dL (ref 30.0–36.0)
MCV: 84.8 fL (ref 80.0–100.0)
Platelets: 215 10*3/uL (ref 150–400)
RBC: 3.48 MIL/uL — ABNORMAL LOW (ref 4.22–5.81)
RDW: 16.4 % — ABNORMAL HIGH (ref 11.5–15.5)
WBC: 21.9 10*3/uL — ABNORMAL HIGH (ref 4.0–10.5)
nRBC: 0.1 % (ref 0.0–0.2)

## 2018-10-20 LAB — PROTIME-INR
INR: 1.47
Prothrombin Time: 17.7 seconds — ABNORMAL HIGH (ref 11.4–15.2)

## 2018-10-20 LAB — AMMONIA: Ammonia: 43 umol/L — ABNORMAL HIGH (ref 9–35)

## 2018-10-20 LAB — MAGNESIUM: Magnesium: 3.2 mg/dL — ABNORMAL HIGH (ref 1.7–2.4)

## 2018-10-20 LAB — LACTIC ACID, PLASMA: Lactic Acid, Venous: 1.1 mmol/L (ref 0.5–1.9)

## 2018-10-20 LAB — HEPATITIS B SURFACE ANTIGEN: Hepatitis B Surface Ag: NEGATIVE

## 2018-10-20 MED ORDER — IPRATROPIUM-ALBUTEROL 0.5-2.5 (3) MG/3ML IN SOLN
3.0000 mL | Freq: Four times a day (QID) | RESPIRATORY_TRACT | Status: DC
  Administered 2018-10-20 – 2018-10-22 (×8): 3 mL via RESPIRATORY_TRACT
  Filled 2018-10-20 (×8): qty 3

## 2018-10-20 MED ORDER — VANCOMYCIN HCL 500 MG IV SOLR
500.0000 mg | Freq: Four times a day (QID) | Status: DC
  Administered 2018-10-20 – 2018-10-22 (×8): 500 mg via RECTAL
  Filled 2018-10-20 (×11): qty 500

## 2018-10-20 NOTE — Progress Notes (Signed)
Patient ID: Javier Craw., male   DOB: 1956-02-02, 63 y.o.   MRN: [id number]  [facility] Progress Note   Assessment/ Plan:   1.  Acute kidney injury on chronic kidney disease stage IV:  Started on CRRT yesterday after developing hypotension requiring fluid boluses and consideration of pressors. No UF at this time based on volume assessment. Overall prognosis appears to be poor and dialysis is primarily to try and make a positive impact on his encephalopathy (multifactorial). Will monitor for electrolyte wasting.  2.  Anion gap metabolic acidosis: Secondary to acute kidney injury/ketosis-corrected with isotonic bicarbonate and now on CRRT. 3.  Encephalopathy: Likely metabolic encephalopathy with multiple etiologies including acute liver injury/SIRS and possibly uremia.  Now on CRRT. 4.    Awaited LFTs/bilirubin: No evidence of acute cholecystitis requiring surgical intervention or percutaneous cholecystostomy tube placement. Appreciate GI input. 5.  History of diastolic heart failure: Appears to be compensated-we will monitor with dialysis. 6.  Clostridium difficile colitis: will have core-track placed for enteral medications/fluids.   Subjective:   Underwent HD catheter placement yesterday and started on CRRT after developing hypotension prior to starting HD.   Objective:   BP 112/75   Pulse 72   Temp (!) 96.5 F (35.8 C) (Axillary)   Resp 16   Ht 6' 1"  (1.854 m)   Wt 109.7 kg   SpO2 98%   BMI 31.91 kg/m   Intake/Output Summary (Last 24 hours) at 10/20/2018 0817 Last data filed at 10/20/2018 0700 Gross per 24 hour  Intake 3912.53 ml  Output 2197 ml  Net 1715.53 ml   Weight change: 4.3 kg  Physical Exam: Gen: Sedated, appears comfortable.   CVS: Pulse regular rhythm, normal rate, S1 and S2 normal Resp: Coarse/transmitted breath sounds bilaterally. Abd: Soft, obese, nontender Ext: No lower extremity edema  Imaging: Ir US Guide Vasc Access Right  Result  Date: 10/18/2018 INDICATION: 63 year old with acute on chronic renal failure. Encephalopathy. Request for non tunneled dialysis catheter. EXAM: FLUOROSCOPIC AND ULTRASOUND GUIDED PLACEMENT OF A NON-TUNNELED DIALYSIS CATHETER Physician: Stephan Minister. Henn, MD MEDICATIONS: None ANESTHESIA/SEDATION: Ativan 0.5 mg prior to the procedure FLUOROSCOPY TIME:  Fluoroscopy Time:  minutes  seconds ( mGy). COMPLICATIONS: None immediate. PROCEDURE: Patient is encephalopathic and can not give informed consent. There is no family or POA available for consent. Therefore, emergency consent was performed. Patient was combative and unable to perform the procedure on the fluoroscopic table. Procedure was performed in the patient's bed. Ultrasound confirmed a patent right internal jugular vein. Ultrasound images were obtained for documentation. The right side of the neck was prepped and draped in a sterile fashion. The right side of the neck was anesthetized with 1% lidocaine. Maximal barrier sterile technique was utilized including caps, mask, sterile gowns, sterile gloves, sterile drape, hand hygiene and skin antiseptic. A small incision was made with #11 blade scalpel. A 21 gauge needle directed into the right internal jugular vein with ultrasound guidance. A micropuncture dilator set was placed. A 16 cm Mahurkar catheter was selected. The catheter was advanced easily over the stiff Amplatz wire. Both dialysis lumens were found to aspirate and flush well. The proper amount of heparin was flushed in both lumens. The central venous lumen was flushed with normal saline. Catheter was sutured to skin. Dressing was placed over the catheter. Follow-up portable chest x-ray demonstrated that the central line was in the lower SVC and appropriately positioned. FINDINGS: Catheter tip in the lower SVC based on follow-up chest radiograph.  IMPRESSION: Successful placement of a right jugular non-tunneled dialysis catheter using ultrasound guidance.  Electronically Signed   By: Markus Daft M.D.   On: 10/18/2018 12:52   Dg Chest Port 1 View  Result Date: 10/19/2018 CLINICAL DATA:  63 year old male with a history of central line placement EXAM: PORTABLE CHEST 1 VIEW COMPARISON:  10/18/2018, 10/02/2018 FINDINGS: Cardiomediastinal silhouette likely unchanged, though the right rotation significantly limits evaluation of the chest. Low lung volumes persist. No new confluent airspace disease. Linear opacities at the lung bases similar to prior. No pneumothorax. No large pleural effusion. Interval removal of right IJ hemodialysis catheter and placement of a new temporary left IJ hemodialysis catheter. Tip appears to terminate at the superior cavoatrial junction. IMPRESSION: Interval placement of left IJ hemodialysis catheter and removal of the right IJ line. No complicating features. Low lung volumes and likely atelectasis/scarring. Electronically Signed   By: Corrie Mckusick D.O.   On: 10/19/2018 14:48   Dg Chest Port 1 View  Result Date: 10/18/2018 CLINICAL DATA:  Shortness of breath after right internal jugular dialysis catheter placement. EXAM: PORTABLE CHEST 1 VIEW COMPARISON:  Chest x-ray dated October 14, 2018. FINDINGS: Interval placement of a right internal jugular dialysis catheter with the tip in the proximal right atrium. Stable cardiomediastinal silhouette. Normal pulmonary vascularity. Unchanged low lung volumes with mild bibasilar atelectasis. Unchanged elevation of the left hemidiaphragm. No focal consolidation, pleural effusion, or pneumothorax. No acute osseous abnormality. IMPRESSION: 1. No active disease. 2. Appropriately positioned right internal jugular dialysis catheter. Electronically Signed   By: Titus Dubin M.D.   On: 10/18/2018 12:41    Labs: BMET Recent Labs  Lab 10/18/2018 2225 10/15/18 0723 10/16/18 0419 10/17/18 0408 10/18/18 0407 10/19/18 0632 10/19/18 1600 10/19/18 1925 10/20/18 0457  NA  --  131* 135 137 140 139  138 140 138  K  --  4.0 3.6 3.5 3.6 3.4* 3.3* 3.3* 4.1  CL  --  95* 92* 87* 86* 82*  --  83* 93*  CO2  --  15* 21* 24 28 30   --  26 26  GLUCOSE  --  168* 145* 168* 159* 174*  --  120* 126*  BUN  --  121* 131* 141* 157* 172*  --  171* 123*  CREATININE  --  6.10* 6.45* 6.74* 7.29* 7.74*  --  7.45* 5.65*  CALCIUM  --  6.9* 6.4* 5.8* 6.0* 5.7*  --  5.7* 7.0*  PHOS 7.1*  --  9.7*  --   --   --   --  >30.0* 8.7*   CBC Recent Labs  Lab 10/01/2018 1957 10/15/18 0723  10/18/18 0407 10/19/18 0632 10/19/18 1600 10/19/18 1925 10/20/18 0457  WBC 15.9* 14.8*   < > 20.0* 22.0*  --  21.2* 21.9*  NEUTROABS 14.5* 13.5*  --   --   --   --   --   --   HGB 9.7* 9.2*   < > 9.9* 9.9* 9.9* 9.0* 10.0*  HCT 30.4* 28.1*   < > 29.2* 30.1* 29.0* 26.6* 29.5*  MCV 86.9 85.2   < > 82.5 82.7  --  83.9 84.8  PLT 233 210   < > 266 247  --  220 215   < > = values in this interval not displayed.    Medications:    . budesonide (PULMICORT) nebulizer solution  0.25 mg Nebulization BID  . chlorhexidine  15 mL Mouth Rinse BID  . Chlorhexidine Gluconate Cloth  6  each Topical [id number]  . insulin aspart  0-9 Units Subcutaneous Q4H  . ipratropium-albuterol  3 mL Nebulization QID  . mouth rinse  15 mL Mouth Rinse q12n4p  . vancomycin  125 mg Oral QID   Elmarie Shiley, MD 10/20/2018, 8:17 AM

## 2018-10-20 NOTE — Progress Notes (Signed)
Pharmacy Antibiotic Note  Javier Ho. is a 63 y.o. male admitted on 10/17/2018 with possible acute cholecystitis.  Pharmacy has been consulted for Zosyn dosing.   Patient now on CRRT. Patient was having diarrhea - cdiff is positive. He is sedated on Precedex - unable to take PO vancomycin - this was switched to vancomycin enemas.  Plan: Zosyn 3.375g IV q6h CRRT Vancomycin enema until he can tolerate PO   Height: 6' 1"  (185.4 cm) Weight: 241 lb 13.5 oz (109.7 kg) IBW/kg (Calculated) : 79.9  Temp (24hrs), Avg:96.6 F (35.9 C), Min:95.8 F (35.4 C), Max:97.6 F (36.4 C)  Recent Labs  Lab 10/25/2018 2234 10/19/2018 2341  10/17/18 0408 10/18/18 0407 10/19/18 0632 10/19/18 1925 10/20/18 0457  WBC  --   --    < > 18.5* 20.0* 22.0* 21.2* 21.9*  CREATININE  --   --    < > 6.74* 7.29* 7.74* 7.45* 5.65*  LATICACIDVEN 0.76 0.63  --   --   --   --   --  1.1   < > = values in this interval not displayed.    Estimated Creatinine Clearance: 17.2 mL/min (A) (by C-G formula based on SCr of 5.65 mg/dL (H)).    Allergies  Allergen Reactions  . Sulfa Antibiotics Nausea And Vomiting    unknown  . Meperidine Other (See Comments)    "makes me crazy"   Antibiotics This Admission: Zosyn 1/20 >> Enteral vancomycin 1/26 >>  Microbiology Results: 1/25 cdif: positive 1/21 BCx: negative 1/20 UCx: yeast 1/25 mrsa pcr: pos  Harvel Quale 10/20/2018 10:42 AM

## 2018-10-20 NOTE — Progress Notes (Signed)
Subjective: Manageable on Precedex.  Objective: Vital signs in last 24 hours: Temp:  [95.8 F (35.4 C)-97.6 F (36.4 C)] 97.4 F (36.3 C) (01/26 0400) Pulse Rate:  [65-79] 72 (01/26 0700) Resp:  [11-23] 16 (01/26 0700) BP: (63-153)/(29-128) 112/75 (01/26 0700) SpO2:  [90 %-100 %] 98 % (01/26 0700) Weight:  [109.7 kg] 109.7 kg (01/26 0430) Last BM Date: 10/20/18  Intake/Output from previous day: 01/25 0701 - 01/26 0700 In: 3912.5 [I.V.:2131.9; IV Piggyback:1780.6] Out: 2197 [Urine:325; Stool:500] Intake/Output this shift: No intake/output data recorded.  General appearance: sedated Resp: clear to auscultation bilaterally Cardio: regular rate and rhythm GI: soft, non-tender; bowel sounds normal; no masses,  no organomegaly and distended Extremities: extremities normal, atraumatic, no cyanosis or edema  Lab Results: Recent Labs    10/19/18 0632 10/19/18 1600 10/19/18 1925 10/20/18 0457  WBC 22.0*  --  21.2* 21.9*  HGB 9.9* 9.9* 9.0* 10.0*  HCT 30.1* 29.0* 26.6* 29.5*  PLT 247  --  220 215   BMET Recent Labs    10/19/18 0632 10/19/18 1600 10/19/18 1925 10/20/18 0457  NA 139 138 140 138  K 3.4* 3.3* 3.3* 4.1  CL 82*  --  83* 93*  CO2 30  --  26 26  GLUCOSE 174*  --  120* 126*  BUN 172*  --  171* 123*  CREATININE 7.74*  --  7.45* 5.65*  CALCIUM 5.7*  --  5.7* 7.0*   LFT Recent Labs    10/20/18 0457  PROT 6.7  ALBUMIN 2.4*  2.4*  AST 2,244*  ALT 247*  ALKPHOS PENDING  BILITOT 14.1*  BILIDIR 9.3*  IBILI 4.8*   PT/INR Recent Labs    10/19/18 1018 10/20/18 0457  LABPROT 17.3* 17.7*  INR 1.44 1.47   Hepatitis Panel No results for input(s): HEPBSAG, HCVAB, HEPAIGM, HEPBIGM in the last 72 hours. C-Diff Recent Labs    10/19/18 0615  CDIFFTOX NEGATIVE   Fecal Lactopherrin No results for input(s): FECLLACTOFRN in the last 72 hours.  Studies/Results: Ir US Guide Vasc Access Right  Result Date: 10/18/2018 INDICATION: 63 year old with acute on  chronic renal failure. Encephalopathy. Request for non tunneled dialysis catheter. EXAM: FLUOROSCOPIC AND ULTRASOUND GUIDED PLACEMENT OF A NON-TUNNELED DIALYSIS CATHETER Physician: Stephan Minister. Henn, MD MEDICATIONS: None ANESTHESIA/SEDATION: Ativan 0.5 mg prior to the procedure FLUOROSCOPY TIME:  Fluoroscopy Time:  minutes  seconds ( mGy). COMPLICATIONS: None immediate. PROCEDURE: Patient is encephalopathic and can not give informed consent. There is no family or POA available for consent. Therefore, emergency consent was performed. Patient was combative and unable to perform the procedure on the fluoroscopic table. Procedure was performed in the patient's bed. Ultrasound confirmed a patent right internal jugular vein. Ultrasound images were obtained for documentation. The right side of the neck was prepped and draped in a sterile fashion. The right side of the neck was anesthetized with 1% lidocaine. Maximal barrier sterile technique was utilized including caps, mask, sterile gowns, sterile gloves, sterile drape, hand hygiene and skin antiseptic. A small incision was made with #11 blade scalpel. A 21 gauge needle directed into the right internal jugular vein with ultrasound guidance. A micropuncture dilator set was placed. A 16 cm Mahurkar catheter was selected. The catheter was advanced easily over the stiff Amplatz wire. Both dialysis lumens were found to aspirate and flush well. The proper amount of heparin was flushed in both lumens. The central venous lumen was flushed with normal saline. Catheter was sutured to skin. Dressing was placed over  the catheter. Follow-up portable chest x-ray demonstrated that the central line was in the lower SVC and appropriately positioned. FINDINGS: Catheter tip in the lower SVC based on follow-up chest radiograph. IMPRESSION: Successful placement of a right jugular non-tunneled dialysis catheter using ultrasound guidance. Electronically Signed   By: Markus Daft M.D.   On: 10/18/2018  12:52   Dg Chest Port 1 View  Result Date: 10/19/2018 CLINICAL DATA:  63 year old male with a history of central line placement EXAM: PORTABLE CHEST 1 VIEW COMPARISON:  10/18/2018, 10/10/2018 FINDINGS: Cardiomediastinal silhouette likely unchanged, though the right rotation significantly limits evaluation of the chest. Low lung volumes persist. No new confluent airspace disease. Linear opacities at the lung bases similar to prior. No pneumothorax. No large pleural effusion. Interval removal of right IJ hemodialysis catheter and placement of a new temporary left IJ hemodialysis catheter. Tip appears to terminate at the superior cavoatrial junction. IMPRESSION: Interval placement of left IJ hemodialysis catheter and removal of the right IJ line. No complicating features. Low lung volumes and likely atelectasis/scarring. Electronically Signed   By: Corrie Mckusick D.O.   On: 10/19/2018 14:48   Dg Chest Port 1 View  Result Date: 10/18/2018 CLINICAL DATA:  Shortness of breath after right internal jugular dialysis catheter placement. EXAM: PORTABLE CHEST 1 VIEW COMPARISON:  Chest x-ray dated October 14, 2018. FINDINGS: Interval placement of a right internal jugular dialysis catheter with the tip in the proximal right atrium. Stable cardiomediastinal silhouette. Normal pulmonary vascularity. Unchanged low lung volumes with mild bibasilar atelectasis. Unchanged elevation of the left hemidiaphragm. No focal consolidation, pleural effusion, or pneumothorax. No acute osseous abnormality. IMPRESSION: 1. No active disease. 2. Appropriately positioned right internal jugular dialysis catheter. Electronically Signed   By: Titus Dubin M.D.   On: 10/18/2018 12:41    Medications:  Scheduled: . budesonide (PULMICORT) nebulizer solution  0.25 mg Nebulization BID  . chlorhexidine  15 mL Mouth Rinse BID  . Chlorhexidine Gluconate Cloth  6 each Topical Q0600  . insulin aspart  0-9 Units Subcutaneous Q4H  .  ipratropium-albuterol  3 mL Nebulization QID  . mouth rinse  15 mL Mouth Rinse q12n4p  . vancomycin  125 mg Oral QID   Continuous: .  prismasol BGK 4/2.5 400 mL/hr at 10/20/18 0653  .  prismasol BGK 4/2.5 200 mL/hr at 10/19/18 1839  . sodium chloride    . sodium chloride    . dexmedetomidine (PRECEDEX) IV infusion 0.7 mcg/kg/hr (10/20/18 0700)  . phenylephrine (NEO-SYNEPHRINE) Adult infusion 35 mcg/min (10/20/18 0700)  . piperacillin-tazobactam Stopped (10/20/18 5400)  . prismasol BGK 4/2.5 1,500 mL/hr at 10/20/18 0430  .  sodium bicarbonate (isotonic) infusion in sterile water Stopped (10/18/18 1340)    Assessment/Plan: 1) C. Diff. 2) Abnormal liver enzymes. 3) Cirrhosis. 4) Renal failure.   The patient is much better with Precedex and he is on dialysis, but he remains in a critically ill state.  Again, his clinical presentation is very complex.  The patient's liver enzymes continue to be elevated, but there is a mild decline of the AST.  Etiologies for toxic, viral, and vascular abnormalities were not identified, however, the markedly elevated AP is consistent with a severe infection.  The current findings are that he has C. Diff and oral vancomycin was ordered, however, nursing reports that it has not been able to be administered.  The patient also remains on Zosyn.  He was found to have a positive HBcIgG positivity and no surface Ab or Ag.  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  Most likely he has surface Ab and a booster immunization will allow for detectable levels.  Plan: 1) Vanc enema if he cannot obtain oral. 2) Try to minimize other antibiotics as this can hinder the treatment of C. Diff. 3) Continue with supportive care.  LOS: 6 days   Lakeyia Surber D 10/20/2018, 7:41 AM
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
## 2018-10-21 ENCOUNTER — Encounter (HOSPITAL_COMMUNITY): Payer: Self-pay | Admitting: Radiology

## 2018-10-21 ENCOUNTER — Inpatient Hospital Stay (HOSPITAL_COMMUNITY): Payer: Medicare HMO

## 2018-10-21 DIAGNOSIS — A419 Sepsis, unspecified organism: Secondary | ICD-10-CM

## 2018-10-21 DIAGNOSIS — R6521 Severe sepsis with septic shock: Secondary | ICD-10-CM

## 2018-10-21 DIAGNOSIS — N179 Acute kidney failure, unspecified: Secondary | ICD-10-CM

## 2018-10-21 DIAGNOSIS — N189 Chronic kidney disease, unspecified: Secondary | ICD-10-CM

## 2018-10-21 HISTORY — PX: IR FLUORO GUIDE CV LINE RIGHT: IMG2283

## 2018-10-21 LAB — PROTIME-INR
INR: 1.33
PROTHROMBIN TIME: 16.4 s — AB (ref 11.4–15.2)

## 2018-10-21 LAB — CBC
HCT: 30 % — ABNORMAL LOW (ref 39.0–52.0)
Hemoglobin: 9.7 g/dL — ABNORMAL LOW (ref 13.0–17.0)
MCH: 28 pg (ref 26.0–34.0)
MCHC: 32.3 g/dL (ref 30.0–36.0)
MCV: 86.5 fL (ref 80.0–100.0)
NRBC: 0.2 % (ref 0.0–0.2)
PLATELETS: 185 10*3/uL (ref 150–400)
RBC: 3.47 MIL/uL — ABNORMAL LOW (ref 4.22–5.81)
RDW: 16.8 % — ABNORMAL HIGH (ref 11.5–15.5)
WBC: 24.7 10*3/uL — ABNORMAL HIGH (ref 4.0–10.5)

## 2018-10-21 LAB — RENAL FUNCTION PANEL
Albumin: 2.1 g/dL — ABNORMAL LOW (ref 3.5–5.0)
Anion gap: 12 (ref 5–15)
BUN: 61 mg/dL — ABNORMAL HIGH (ref 8–23)
CO2: 21 mmol/L — ABNORMAL LOW (ref 22–32)
Calcium: 8.8 mg/dL — ABNORMAL LOW (ref 8.9–10.3)
Chloride: 102 mmol/L (ref 98–111)
Creatinine, Ser: 2.78 mg/dL — ABNORMAL HIGH (ref 0.61–1.24)
GFR calc Af Amer: 27 mL/min — ABNORMAL LOW (ref >60)
GFR calc non Af Amer: 23 mL/min — ABNORMAL LOW (ref >60)
Glucose, Bld: 135 mg/dL — ABNORMAL HIGH (ref 70–99)
POTASSIUM: 4.4 mmol/L (ref 3.5–5.1)
Phosphorus: 5.5 mg/dL — ABNORMAL HIGH (ref 2.5–4.6)
Sodium: 135 mmol/L (ref 135–145)

## 2018-10-21 LAB — COMPREHENSIVE METABOLIC PANEL
ALT: 451 U/L — ABNORMAL HIGH (ref 0–44)
AST: 2267 U/L — AB (ref 15–41)
Albumin: 2.1 g/dL — ABNORMAL LOW (ref 3.5–5.0)
Alkaline Phosphatase: 2270 U/L — ABNORMAL HIGH (ref 38–126)
Anion gap: 12 (ref 5–15)
BUN: 72 mg/dL — AB (ref 8–23)
CO2: 24 mmol/L (ref 22–32)
Calcium: 8.3 mg/dL — ABNORMAL LOW (ref 8.9–10.3)
Chloride: 101 mmol/L (ref 98–111)
Creatinine, Ser: 3.53 mg/dL — ABNORMAL HIGH (ref 0.61–1.24)
GFR calc Af Amer: 20 mL/min — ABNORMAL LOW (ref >60)
GFR, EST NON AFRICAN AMERICAN: 17 mL/min — AB (ref >60)
Glucose, Bld: 118 mg/dL — ABNORMAL HIGH (ref 70–99)
POTASSIUM: 4.2 mmol/L (ref 3.5–5.1)
Sodium: 137 mmol/L (ref 135–145)
TOTAL PROTEIN: 6.3 g/dL — AB (ref 6.5–8.1)
Total Bilirubin: 16 mg/dL — ABNORMAL HIGH (ref 0.3–1.2)

## 2018-10-21 LAB — GLUCOSE, CAPILLARY
GLUCOSE-CAPILLARY: 159 mg/dL — AB (ref 70–99)
Glucose-Capillary: 99 mg/dL (ref 70–99)
Glucose-Capillary: 110 mg/dL — ABNORMAL HIGH (ref 70–99)
Glucose-Capillary: 116 mg/dL — ABNORMAL HIGH (ref 70–99)
Glucose-Capillary: 126 mg/dL — ABNORMAL HIGH (ref 70–99)
Glucose-Capillary: 143 mg/dL — ABNORMAL HIGH (ref 70–99)
Glucose-Capillary: 143 mg/dL — ABNORMAL HIGH (ref 70–99)
Glucose-Capillary: 167 mg/dL — ABNORMAL HIGH (ref 70–99)

## 2018-10-21 LAB — PHOSPHORUS: Phosphorus: 6.5 mg/dL — ABNORMAL HIGH (ref 2.5–4.6)

## 2018-10-21 LAB — MAGNESIUM
Magnesium: 2.8 mg/dL — ABNORMAL HIGH (ref 1.7–2.4)
Magnesium: 2.7 mg/dL — ABNORMAL HIGH (ref 1.7–2.4)
Magnesium: 2.7 mg/dL — ABNORMAL HIGH (ref 1.7–2.4)

## 2018-10-21 MED ORDER — LORAZEPAM 2 MG/ML IJ SOLN
1.0000 mg | INTRAMUSCULAR | Status: DC | PRN
  Administered 2018-10-21 – 2018-10-22 (×2): 2 mg via INTRAVENOUS
  Filled 2018-10-21 (×2): qty 1

## 2018-10-21 MED ORDER — MUPIROCIN 2 % EX OINT
1.0000 "application " | TOPICAL_OINTMENT | Freq: Two times a day (BID) | CUTANEOUS | Status: DC
  Administered 2018-10-21 – 2018-10-22 (×3): 1 via NASAL
  Filled 2018-10-21: qty 22

## 2018-10-21 MED ORDER — NEPRO/CARBSTEADY PO LIQD
1000.0000 mL | ORAL | Status: DC
  Administered 2018-10-21: 1000 mL via ORAL
  Filled 2018-10-21 (×3): qty 1000

## 2018-10-21 MED ORDER — NOREPINEPHRINE-SODIUM CHLORIDE 4-0.9 MG/250ML-% IV SOLN
0.0000 ug/min | INTRAVENOUS | Status: DC
  Administered 2018-10-21: 20 ug/min via INTRAVENOUS
  Administered 2018-10-21: 5 ug/min via INTRAVENOUS
  Administered 2018-10-22: 6 ug/min via INTRAVENOUS
  Filled 2018-10-21 (×3): qty 250

## 2018-10-21 MED ORDER — VITAL HIGH PROTEIN PO LIQD: 1000.0000 mL | ORAL | Status: DC

## 2018-10-21 MED ORDER — VANCOMYCIN 50 MG/ML ORAL SOLUTION
500.0000 mg | Freq: Four times a day (QID) | ORAL | Status: DC
  Administered 2018-10-21 – 2018-10-22 (×4): 500 mg
  Filled 2018-10-21 (×5): qty 10

## 2018-10-21 MED ORDER — PRO-STAT SUGAR FREE PO LIQD
30.0000 mL | Freq: Three times a day (TID) | ORAL | Status: DC
  Administered 2018-10-21 – 2018-10-22 (×3): 30 mL
  Filled 2018-10-21 (×3): qty 30

## 2018-10-21 NOTE — Progress Notes (Signed)
Patient ID: Javier Ho., male   DOB: 07/05/1956, 63 y.o.   MRN: [id number] [facility] Progress Note   Assessment/ Plan:   1.  Acute kidney injury on chronic kidney disease stage IV:  On CRRT for management of AKI with possible uremic encephalopathy.  Unfortunately, based on associated labs and overall picture, exceedingly poor prognosis noted at this time and would recommend another 24-48 hours of trial of therapy before switching to comfort/palliative care measures only.  2.  Anion gap metabolic acidosis: Secondary to acute kidney injury/ketosis-improved with isotonic sodium bicarbonate/CRRT. 3.  Encephalopathy: Likely metabolic encephalopathy with multiple etiologies including acute liver injury/SIRS and possibly uremia.  Now on CRRT. 4.    Awaited LFTs/bilirubin: No evidence of acute cholecystitis requiring surgical intervention or percutaneous cholecystostomy tube placement.  Rising bilirubin/transaminases noted on labs-ongoing GI follow-up with presumptive etiology of severe infection. 5.  History of diastolic heart failure: Appears to be compensated-we will monitor with dialysis. 6.  Clostridium difficile colitis: will have core-track placed for enteral medications/fluids.  Difficult situation-with inability to get in touch with family to help with decision-making and what appears to be so far irreversible liver injury and continued encephalopathy, would recommend CRRT for another 24 hours before making a multi-physician decision regarding stopping care is overall prognosis is grim.  Subjective:   Underwent HD catheter placement yesterday and started on CRRT after developing hypotension prior to starting HD.   Objective:   BP (!) 99/56   Pulse 78   Temp 98.1 F (36.7 C) (Axillary)   Resp 20   Ht 6' 1"  (1.854 m)   Wt 108.2 kg   SpO2 96%   BMI 31.47 kg/m   Intake/Output Summary (Last 24 hours) at 10/21/2018 0750 Last data filed at 10/21/2018 0700 Gross per 24  hour  Intake 2631.35 ml  Output 3264 ml  Net -632.65 ml   Weight change: -1.5 kg  Physical Exam: Gen: Sedated, appears comfortable.   CVS: Pulse regular rhythm, normal rate, S1 and S2 normal Resp: Coarse/transmitted breath sounds bilaterally. Abd: Soft, obese, nontender Ext: No lower extremity edema  Imaging: Dg Chest Port 1 View  Result Date: 10/19/2018 CLINICAL DATA:  63 year old male with a history of central line placement EXAM: PORTABLE CHEST 1 VIEW COMPARISON:  10/18/2018, 10/10/2018 FINDINGS: Cardiomediastinal silhouette likely unchanged, though the right rotation significantly limits evaluation of the chest. Low lung volumes persist. No new confluent airspace disease. Linear opacities at the lung bases similar to prior. No pneumothorax. No large pleural effusion. Interval removal of right IJ hemodialysis catheter and placement of a new temporary left IJ hemodialysis catheter. Tip appears to terminate at the superior cavoatrial junction. IMPRESSION: Interval placement of left IJ hemodialysis catheter and removal of the right IJ line. No complicating features. Low lung volumes and likely atelectasis/scarring. Electronically Signed   By: Corrie Mckusick D.O.   On: 10/19/2018 14:48    Labs: BMET Recent Labs  Lab 10/19/2018 2225  10/16/18 0419 10/17/18 0408 10/18/18 0407 10/19/18 0177 10/19/18 1600 10/19/18 1925 10/20/18 0457 10/20/18 0923 10/20/18 1617 10/21/18 0315  NA  --    < > 135 137 140 139 138 140 138 139 137 137   K  --    < > 3.6 3.5 3.6 3.4* 3.3* 3.3* 4.1 4.1 4.0 4.2  CL  --    < > 92* 87* 86* 82*  --  83* 93*  --  98 101  CO2  --    < >  21* 24 28 30   --  26 26  --  25 24  GLUCOSE  --    < > 145* 168* 159* 174*  --  120* 126*  --  109* 118*  BUN  --    < > 131* 141* 157* 172*  --  171* 123*  --  94* 72*  CREATININE  --    < > 6.45* 6.74* 7.29* 7.74*  --  7.45* 5.65*  --  4.21* 3.53*  CALCIUM  --    < > 6.4* 5.8* 6.0* 5.7*  --  5.7* 7.0*  --  7.9* 8.3*  PHOS 7.1*   --  9.7*  --   --   --   --  >30.0* 8.7*  --  7.3* 6.5*   < > = values in this interval not displayed.   CBC Recent Labs  Lab 10/17/2018 1957 10/15/18 0723  10/19/18 3244  10/19/18 1925 10/20/18 0457 10/20/18 0923 10/21/18 0315  WBC 15.9* 14.8*   < > 22.0*  --  21.2* 21.9*  --  24.7*  NEUTROABS 14.5* 13.5*  --   --   --   --   --   --   --   HGB 9.7* 9.2*   < > 9.9*   < > 9.0* 10.0* 10.5* 9.7*  HCT 30.4* 28.1*   < > 30.1*   < > 26.6* 29.5* 31.0* 30.0*  MCV 86.9 85.2   < > 82.7  --  83.9 84.8  --  86.5  PLT 233 210   < > 247  --  220 215  --  185   < > = values in this interval not displayed.    Medications:    . budesonide (PULMICORT) nebulizer solution  0.25 mg Nebulization BID  . chlorhexidine  15 mL Mouth Rinse BID  . Chlorhexidine Gluconate Cloth  6 each Topical Q0600  . insulin aspart  0-9 Units Subcutaneous Q4H  . ipratropium-albuterol  3 mL Nebulization Q6H  . mouth rinse  15 mL Mouth Rinse q12n4p  . vancomycin (VANCOCIN) rectal ENEMA  500 mg Rectal Q6H   Elmarie Shiley, MD 10/21/2018, 7:50 AM

## 2018-10-21 NOTE — Plan of Care (Signed)
  Problem: Activity: Goal: Risk for activity intolerance will decrease Outcome: Not Progressing   

## 2018-10-21 NOTE — Procedures (Signed)
Cortrak  Person Inserting Tube:  Rosezetta Schlatter, RD Tube Type:  Cortrak - 43 inches Tube Location:  Left nare Initial Placement:  Stomach Secured by: Bridle Technique Used to Measure Tube Placement:  Documented cm marking at nare/ corner of mouth Cortrak Secured At:  70 cm    Cortrak Tube Team Note:  Consult received to place a Cortrak feeding tube.   X-ray is required, abdominal x-ray has been ordered by the Cortrak team. Please confirm tube placement before using the Cortrak tube.   If the tube becomes dislodged please keep the tube and contact the Cortrak team at [email or web address] (password [email or web address]) for replacement.  If after hours and replacement cannot be delayed, place a NG tube and confirm placement with an abdominal x-ray.      Jarome Matin, MS, RD, LDN, [facility] Inpatient Clinical Dietitian Pager # [phone] After hours/weekend pager # [phone]

## 2018-10-21 NOTE — Progress Notes (Signed)
UNASSIGNED PATIENT Subjective: Since I last evaluated the patient, his overall condition seems to have worsened. Patient seems at times seems encephalopathic and confused agitated and not able to follow commands. He had a hemodialysis catheter placements was started on CRRT after developing hypotension prior to starting hemodialysis. His situation is complicated by C. Difficile colitis. According to the patient's nurse, his brother was in the hospital visiting him today but could not make any decisions regardinghis management and wanted to discuss it with his family .  Objective: Vital signs in last 24 hours: Temp:  [96.8 F (36 C)-98.3 F (36.8 C)] 98.3 F (36.8 C) (01/27 1134) Pulse Rate:  [59-90] 86 (01/27 1500) Resp:  [13-24] 17 (01/27 1500) BP: (74-144)/(45-79) 115/67 (01/27 1500) SpO2:  [85 %-100 %] 100 % (01/27 1500) Weight:  [108.2 kg] 108.2 kg (01/27 0415) Last BM Date: 10/21/18  Intake/Output from previous day: 01/26 0701 - 01/27 0700 In: 2631.4 [I.V.:2381.4; IV Piggyback:50] Out: 7681 [Urine:500; Stool:650] Intake/Output this shift: Total I/O In: 852.6 [I.V.:852.6] Out: 995 [Other:995]  General appearance: appears older than stated age, combative, agitated, fatigued, moderate distress, morbidly obese and toxicappearing Resp: coarse breath sounds bilaterally Cardio: regular rate and rhythm, S1, S2 normal, no murmur, click, rub or gallop GI: soft, non-tender; bowel sounds normal; no masses,  no organomegaly Extremities: extremities normal, atraumatic, no cyanosis or edema  Lab Results: Recent Labs    10/19/18 1925 10/20/18 0457 10/20/18 0923 10/21/18 0315  WBC 21.2* 21.9*  --  24.7*  HGB 9.0* 10.0* 10.5* 9.7*  HCT 26.6* 29.5* 31.0* 30.0*  PLT 220 215  --  185   BMET Recent Labs    10/20/18 0457 10/20/18 0923 10/20/18 1617 10/21/18 0315  NA 138 139 137 137  K 4.1 4.1 4.0 4.2  CL 93*  --  98 101  CO2 26  --  25 24  GLUCOSE 126*  --  109* 118*  BUN 123*   --  94* 72*  CREATININE 5.65*  --  4.21* 3.53*  CALCIUM 7.0*  --  7.9* 8.3*   LFT Recent Labs    10/20/18 0457  10/21/18 0315  PROT 6.7  --  6.3*  ALBUMIN 2.4*  2.4*   < > 2.1*  AST 2,244*  --  2,267*  ALT 247*  --  451*  ALKPHOS 2,021*  --  2,270*  BILITOT 14.1*  --  16.0*  BILIDIR 9.3*  --   --   IBILI 4.8*  --   --    < > = values in this interval not displayed.   PT/INR Recent Labs    10/20/18 0457 10/21/18 0315  LABPROT 17.7* 16.4*  INR 1.47 1.33   Hepatitis Panel No results for input(s): HEPBSAG, HCVAB, HEPAIGM, HEPBIGM in the last 72 hours. C-Diff Recent Labs    10/19/18 0615  CDIFFTOX NEGATIVE   Recent Labs    10/19/18 0615  CDIFFPCR POSITIVE*   Fecal Lactopherrin No results for input(s): FECLLACTOFRN in the last 72 hours.  Studies/Results: Ir Fluoro Guide Cv Line Right  Result Date: 10/21/2018 INDICATION: 63 year old with acute on chronic renal failure. Encephalopathy. Request for non tunneled dialysis catheter.  EXAM: FLUOROSCOPIC AND ULTRASOUND GUIDED PLACEMENT OF A NON-TUNNELED DIALYSIS CATHETER  Physician: Stephan Minister. Henn, MD  MEDICATIONS: None  ANESTHESIA/SEDATION: Ativan 0.5 mg prior to the procedure  FLUOROSCOPY TIME:  Fluoroscopy Time:  minutes  seconds ( mGy).  COMPLICATIONS: None immediate.  PROCEDURE: Patient is encephalopathic and can not give informed consent.  There is no family or POA available for consent. Therefore, emergency consent was performed. Patient was combative and unable to perform the procedure on the fluoroscopic table. Procedure was performed in the patient's bed. Ultrasound confirmed a patent right internal jugular vein. Ultrasound images were obtained for documentation. The right side of the neck was prepped and draped in a sterile fashion. The right side of the neck was anesthetized with 1% lidocaine. Maximal barrier sterile technique was utilized including caps, mask, sterile gowns, sterile gloves, sterile drape, hand  hygiene and skin antiseptic. A small incision was made with #11 blade scalpel. A 21 gauge needle directed into the right internal jugular vein with ultrasound guidance. A micropuncture dilator set was placed. A 16 cm Mahurkar catheter was selected. The catheter was advanced easily over the stiff Amplatz wire. Both dialysis lumens were found to aspirate and flush well. The proper amount of heparin was flushed in both lumens. The central venous lumen was flushed with normal saline. Catheter was sutured to skin. Dressing was placed over the catheter.  Follow-up portable chest x-ray demonstrated that the central line was in the lower SVC and appropriately positioned.  FINDINGS: Catheter tip in the lower SVC based on follow-up chest radiograph.  IMPRESSION: Successful placement of a right jugular non-tunneled dialysis catheter using ultrasound guidance.   Electronically Signed   By: Markus Daft M.D.   On: 10/18/2018 12:52  Dg Abd Portable 1v  Result Date: 10/21/2018 CLINICAL DATA:  Status post feeding tube placement. EXAM: PORTABLE ABDOMEN - 1 VIEW COMPARISON:  None. FINDINGS: Feeding tube is in place with its tip in the fundus of the stomach. Large volume of gas and stool in the imaged colon is noted. IMPRESSION: As above. Electronically Signed   By: Inge Rise M.D.   On: 10/21/2018 10:53   Medications: I have reviewed the patient's current medications.  Assessment/Plan: 1) Elevated liver function tests with a positive Hepatitis C antibody and a confusing picture with markedly distended gallbladder with mild wall thickening and pericholecystic edema-not thought to be secondary to cholecystitis by both the surgical and the IR team-probably related to hepatocellular disease. His total bili is now up to 16 with an alkaline phosphatase of 2270 nail T of 2260. Very confusing picture.  2) Metabolic encephalopathy with multiple etiologies including acute liver injury/SIRS. 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 . LOS: 7 days   Ernestyne Caldwell 10/21/2018, 3:14 PM

## 2018-10-21 NOTE — Progress Notes (Signed)
[facility] Progress Note Patient Name: Javier Ho. DOB: 12-20-1955 MRN: [id number]   Date of Service  10/21/2018  HPI/Events of Note  Request to renew restraint orders.   eICU Interventions  Will renew restraint orders.      Intervention Category Minor Interventions: Agitation / anxiety - evaluation and management  Sommer,Steven Eugene 10/21/2018, 10:15 PM
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
## 2018-10-21 NOTE — Progress Notes (Signed)
Had prayer with patient and reminded him he is not alone as God is with [place].  Prayer for peach and sense of God's presence and for staff as they care for him and for him and his family to feel God's peace and comfort in this time.   Conard Novak, Chaplain   10/21/18 2100  Clinical Encounter Type  Visited With Patient;Health care provider  Visit Type Spiritual support;Critical Care  Referral From Family  Consult/Referral To Chaplain  Spiritual Encounters  Spiritual Needs Prayer;Emotional  Stress Factors  Patient Stress Factors Health changes

## 2018-10-21 NOTE — Progress Notes (Addendum)
Initial Nutrition Assessment  DOCUMENTATION CODES:   Obesity unspecified  INTERVENTION:  - Once Cortrak placement confirmed and tube able to be used, recommend 30 ml Prostat TID with Nepro @ 30 ml/hr to advance by 10 ml every 8 hours to reach goal rate of 50 ml/hr. - At goal rate, this regimen will provide 2460 kcal, 142 grams of protein, and 872 ml free water. - Free water flush, if desired, to be per MD/NP.  Monitor magnesium, potassium, and phosphorus daily for at least 3 days, MD to replete as needed, as pt is at risk for refeeding syndrome given no nutrition for at least 7 days.   ADDENDUM: X-ray read and indicates tip of tube in the fundus of the stomach.   NUTRITION DIAGNOSIS:   Increased nutrient needs related to acute illness, other (see comment)(CRRT) as evidenced by estimated needs.  GOAL:   Patient will meet greater than or equal to 90% of their needs  MONITOR:   TF tolerance, Weight trends, Labs, I & O's  REASON FOR ASSESSMENT:   Other (Comment)(Consult for Cortrak; not previously following)  ASSESSMENT:   63 year-old male with history of alcohol abuse, apparent cirrhosis, HTN, CHF, DM, CKD 4, and COPD. He was admitted with encephalopathy in the setting of severe transaminitis, ARF, s/p fall at home.  He was transferred to the ICU on 1/25 to facilitate HD catheter placement for initiation of CRRT and d/t need for precedex drip.  Patient has been NPO for entirety of admission (7 days). Cortrak placed by this RD earlier this AM; pending x-ray. Patient's brother was at bedside but was unable to provide much nutrition-related information at this time. He is unsure of patient's nutrition shortly PTA. He states UBW of 240 lb and he believes that patient last weighed this 4-5 months ago. Current weight is 238 lb which is consistent with admission weight; lowest weight during hospitalization has been 232 lb.   Patient has been on CRRT since 1/25. Plan for ongoing [place]  discussions with patient's family.     Medications reviewed; sliding scale novolog.  Labs reviewed; CBGs: 110 and 116 mg/dl, BUN: 72 mg/dl, creatinine: 3.53 mg/dl, Ca: 8.3 mg/dl, Phos: 6.5 mg/dl, Mg: 2.8 mg/dl, Alk Phos elevated and trending up, LFTs elevated and trending up, GFR: 16 ml/min.  Drip; precedex @ 1.2 mcg/kg/hr.      NUTRITION - FOCUSED PHYSICAL EXAM:  Completed to upper body only; no muscle and no fat wasting.  Diet Order:   Diet Order            Diet NPO time specified Except for: Sips with Meds, Ice Chips  Diet effective now              EDUCATION NEEDS:   Not appropriate for education at this time  Skin:  Skin Assessment: Reviewed RN Assessment  Last BM:  1/27  Height:   Ht Readings from Last 1 Encounters:  10/15/18 6' 1"  (1.854 m)    Weight:   Wt Readings from Last 1 Encounters:  10/21/18 108.2 kg    Ideal Body Weight:  83.64 kg  BMI:  Body mass index is 31.47 kg/m.  Estimated Nutritional Needs:   Kcal:  0929-5747 kcal  Protein:  135-150 grams  Fluid:  >= 2 L/day      Jarome Matin, MS, RD, LDN, [facility] Inpatient Clinical Dietitian Pager # [phone] After hours/weekend pager # [phone]

## 2018-10-22 DIAGNOSIS — R748 Abnormal levels of other serum enzymes: Secondary | ICD-10-CM

## 2018-10-22 LAB — HEPATIC FUNCTION PANEL
ALT: 663 U/L — ABNORMAL HIGH (ref 0–44)
AST: 2413 U/L — ABNORMAL HIGH (ref 15–41)
Albumin: 1.8 g/dL — ABNORMAL LOW (ref 3.5–5.0)
Alkaline Phosphatase: 2695 U/L — ABNORMAL HIGH (ref 38–126)
Bilirubin, Direct: 10.7 mg/dL — ABNORMAL HIGH (ref 0.0–0.2)
Indirect Bilirubin: 8 mg/dL — ABNORMAL HIGH (ref 0.3–0.9)
Total Bilirubin: 18.7 mg/dL (ref 0.3–1.2)
Total Protein: 6.2 g/dL — ABNORMAL LOW (ref 6.5–8.1)

## 2018-10-22 LAB — CBC
HCT: 29.3 % — ABNORMAL LOW (ref 39.0–52.0)
Hemoglobin: 9.1 g/dL — ABNORMAL LOW (ref 13.0–17.0)
MCH: 28 pg (ref 26.0–34.0)
MCHC: 31.1 g/dL (ref 30.0–36.0)
MCV: 90.2 fL (ref 80.0–100.0)
Platelets: 155 10*3/uL (ref 150–400)
RBC: 3.25 MIL/uL — AB (ref 4.22–5.81)
RDW: 17.9 % — ABNORMAL HIGH (ref 11.5–15.5)
WBC: 23.8 10*3/uL — ABNORMAL HIGH (ref 4.0–10.5)
nRBC: 0.2 % (ref 0.0–0.2)

## 2018-10-22 LAB — RENAL FUNCTION PANEL
ALBUMIN: 1.9 g/dL — AB (ref 3.5–5.0)
Anion gap: 12 (ref 5–15)
BUN: 56 mg/dL — ABNORMAL HIGH (ref 8–23)
CO2: 23 mmol/L (ref 22–32)
Calcium: 8.7 mg/dL — ABNORMAL LOW (ref 8.9–10.3)
Chloride: 100 mmol/L (ref 98–111)
Creatinine, Ser: 2.34 mg/dL — ABNORMAL HIGH (ref 0.61–1.24)
GFR calc Af Amer: 33 mL/min — ABNORMAL LOW (ref >60)
GFR calc non Af Amer: 28 mL/min — ABNORMAL LOW (ref >60)
Glucose, Bld: 195 mg/dL — ABNORMAL HIGH (ref 70–99)
Phosphorus: 5.6 mg/dL — ABNORMAL HIGH (ref 2.5–4.6)
Potassium: 4.1 mmol/L (ref 3.5–5.1)
Sodium: 135 mmol/L (ref 135–145)

## 2018-10-22 LAB — GLUCOSE, CAPILLARY
Glucose-Capillary: 183 mg/dL — ABNORMAL HIGH (ref 70–99)
Glucose-Capillary: 189 mg/dL — ABNORMAL HIGH (ref 70–99)

## 2018-10-22 LAB — MAGNESIUM: Magnesium: 2.8 mg/dL — ABNORMAL HIGH (ref 1.7–2.4)

## 2018-10-22 MED ORDER — LORAZEPAM 1 MG PO TABS: 1.0000 mg | ORAL_TABLET | ORAL | Status: DC | PRN

## 2018-10-22 MED ORDER — LORAZEPAM 2 MG/ML IJ SOLN
1.0000 mg | INTRAMUSCULAR | Status: DC | PRN
  Administered 2018-10-22 – 2018-10-23 (×2): 1 mg via INTRAVENOUS
  Filled 2018-10-22 (×2): qty 1

## 2018-10-22 MED ORDER — ONDANSETRON 4 MG PO TBDP
4.0000 mg | ORAL_TABLET | Freq: Four times a day (QID) | ORAL | Status: DC | PRN
  Filled 2018-10-22: qty 1

## 2018-10-22 MED ORDER — HALOPERIDOL 0.5 MG PO TABS
0.5000 mg | ORAL_TABLET | ORAL | Status: DC | PRN
  Filled 2018-10-22: qty 1

## 2018-10-22 MED ORDER — HALOPERIDOL LACTATE 2 MG/ML PO CONC
0.5000 mg | ORAL | Status: DC | PRN
  Filled 2018-10-22: qty 0.3

## 2018-10-22 MED ORDER — DIPHENHYDRAMINE HCL 50 MG/ML IJ SOLN: 12.5000 mg | INTRAMUSCULAR | Status: DC | PRN

## 2018-10-22 MED ORDER — MAGIC MOUTHWASH
15.0000 mL | Freq: Four times a day (QID) | ORAL | Status: DC | PRN
  Filled 2018-10-22: qty 15

## 2018-10-22 MED ORDER — ONDANSETRON HCL 4 MG/2ML IJ SOLN: 4.0000 mg | Freq: Four times a day (QID) | INTRAMUSCULAR | Status: DC | PRN

## 2018-10-22 MED ORDER — HYDROMORPHONE HCL 1 MG/ML IJ SOLN
0.5000 mg | INTRAMUSCULAR | Status: DC | PRN
  Administered 2018-10-22 – 2018-10-23 (×2): 1 mg via INTRAVENOUS
  Filled 2018-10-22 (×2): qty 1

## 2018-10-22 MED ORDER — DEXMEDETOMIDINE HCL IN NACL 400 MCG/100ML IV SOLN
0.4000 ug/kg/h | INTRAVENOUS | Status: DC
  Administered 2018-10-22: 2 ug/kg/h via INTRAVENOUS
  Filled 2018-10-22: qty 100

## 2018-10-22 MED ORDER — HEPARIN SODIUM (PORCINE) 1000 UNIT/ML IJ SOLN
3000.0000 [IU] | Freq: Once | INTRAMUSCULAR | Status: AC
  Administered 2018-10-22: 2800 [IU]

## 2018-10-22 MED ORDER — LORAZEPAM 2 MG/ML PO CONC: 1.0000 mg | ORAL | Status: DC | PRN

## 2018-10-22 MED ORDER — HALOPERIDOL LACTATE 5 MG/ML IJ SOLN
0.5000 mg | INTRAMUSCULAR | Status: DC | PRN
  Administered 2018-10-22: 0.5 mg via INTRAVENOUS
  Filled 2018-10-22: qty 1

## 2018-10-22 NOTE — Progress Notes (Addendum)
[facility] NEPHROLOGY PROGRESS NOTE  Assessment/ Plan: Pt is a 63 y.o. yo male alcohol abuse, CHF, diabetes, liver cirrhosis admitted with encephalopathy and multiorgan failure.  #Acute kidney injury on CKD stage IV: Started CRRT for the management of AKI and possible uremic encephalopathy.  No change in mental status today.  Given overall clinical picture including multiorgan failure, patient has poor prognosis.  I plan to continue CRRT for another 24 hours.  If no improvement in mental status by tomorrow, I will discontinue CRRT and recommend comfort/palliative care.  Discussed with ICU team, plan family meeting today.  #Encephalopathy: Likely metabolic encephalopathy with multiple etiology including liver, uremia.  No improvement. -Required Precedex  #C. difficile colitis, On vancomycin  #Acute transaminitis liver cirrhosis: GI following.  No evidence of biliary obstruction  Subjective: Not examined ICU.  Patient is unresponsive.  On CRRT with low-dose pressor support.  Review of system unable to obtain. Objective Vital signs in last 24 hours: Vitals:   10/22/18 0800 10/22/18 0815 10/22/18 0817 10/22/18 0821  BP: (!) 81/47 (!) 74/53    Pulse: 70 68    Resp: 16 17    Temp:      TempSrc:      SpO2: 99% 98% 98% 100%  Weight:      Height:       Weight change: -5 kg  Intake/Output Summary (Last 24 hours) at 10/22/2018 5809 Last data filed at 10/22/2018 0800 Gross per 24 hour  Intake 3057.69 ml  Output 3070 ml  Net -12.31 ml       Labs: Basic Metabolic Panel: Recent Labs  Lab 10/21/18 0315 10/21/18 1513 10/22/18 0441  NA 137 135 135  K 4.2 4.4 4.1  CL 101 102 100  CO2 24 21* 23  GLUCOSE 118* 135* 195*  BUN 72* 61* 56*  CREATININE 3.53* 2.78* 2.34*  CALCIUM 8.3* 8.8* 8.7*  PHOS 6.5* 5.5* 5.6*   Liver Function Tests: Recent Labs  Lab 10/19/18 0632  10/20/18 0457  10/21/18 0315 10/21/18 1513 10/22/18 0441  AST 2,564*  --  2,244*  --  2,267*   --   --   ALT 275*  --  247*  --  451*  --   --   ALKPHOS 9,833*  --  2,021*  --  2,270*  --   --   BILITOT 10.9*  --  14.1*  --  16.0*  --   --   PROT 6.4*  --  6.7  --  6.3*  --   --   ALBUMIN 1.9*   < > 2.4*  2.4*   < > 2.1* 2.1* 1.9*   < > = values in this interval not displayed.   No results for input(s): LIPASE, AMYLASE in the last 168 hours. Recent Labs  Lab 10/18/18 2006 10/20/18 0457  AMMONIA 34 43*   CBC: Recent Labs  Lab 10/18/18 0407 10/19/18 8250  10/19/18 1925 10/20/18 0457 10/20/18 0923 10/21/18 0315  WBC 20.0* 22.0*  --  21.2* 21.9*  --  24.7*  HGB 9.9* 9.9*   < > 9.0* 10.0* 10.5* 9.7*  HCT 29.2* 30.1*   < > 26.6* 29.5* 31.0* 30.0*  MCV 82.5 82.7  --  83.9 84.8  --  86.5  PLT 266 247  --  220 215  --  185   < > = values in this interval not displayed.   Cardiac Enzymes: Recent Labs  Lab 10/15/18 1705 10/15/18 1933  TROPONINI 0.05* 0.04*  CBG: Recent Labs  Lab 10/21/18 1727 10/21/18 1931 10/21/18 2349 10/22/18 0424 10/22/18 0757  GLUCAP 143* 167* 159* 183* 189*    Iron Studies: No results for input(s): IRON, TIBC, TRANSFERRIN, FERRITIN in the last 72 hours. Studies/Results: Ir Fluoro Guide Cv Line Right  Result Date: 10/21/2018 INDICATION: 63 year old with acute on chronic renal failure. Encephalopathy. Request for non tunneled dialysis catheter.  EXAM: FLUOROSCOPIC AND ULTRASOUND GUIDED PLACEMENT OF A NON-TUNNELED DIALYSIS CATHETER  Physician: Stephan Minister. Henn, MD  MEDICATIONS: None  ANESTHESIA/SEDATION: Ativan 0.5 mg prior to the procedure  FLUOROSCOPY TIME:  Fluoroscopy Time:  minutes  seconds ( mGy).  COMPLICATIONS: None immediate.  PROCEDURE: Patient is encephalopathic and can not give informed consent. There is no family or POA available for consent. Therefore, emergency consent was performed. Patient was combative and unable to perform the procedure on the fluoroscopic table. Procedure was performed in the patient's bed. Ultrasound  confirmed a patent right internal jugular vein. Ultrasound images were obtained for documentation. The right side of the neck was prepped and draped in a sterile fashion. The right side of the neck was anesthetized with 1% lidocaine. Maximal barrier sterile technique was utilized including caps, mask, sterile gowns, sterile gloves, sterile drape, hand hygiene and skin antiseptic. A small incision was made with #11 blade scalpel. A 21 gauge needle directed into the right internal jugular vein with ultrasound guidance. A micropuncture dilator set was placed. A 16 cm Mahurkar catheter was selected. The catheter was advanced easily over the stiff Amplatz wire. Both dialysis lumens were found to aspirate and flush well. The proper amount of heparin was flushed in both lumens. The central venous lumen was flushed with normal saline. Catheter was sutured to skin. Dressing was placed over the catheter.  Follow-up portable chest x-ray demonstrated that the central line was in the lower SVC and appropriately positioned.  FINDINGS: Catheter tip in the lower SVC based on follow-up chest radiograph.  IMPRESSION: Successful placement of a right jugular non-tunneled dialysis catheter using ultrasound guidance.   Electronically Signed   By: Markus Daft M.D.   On: 10/18/2018 12:52  Dg Abd Portable 1v  Result Date: 10/21/2018 CLINICAL DATA:  Status post feeding tube placement. EXAM: PORTABLE ABDOMEN - 1 VIEW COMPARISON:  None. FINDINGS: Feeding tube is in place with its tip in the fundus of the stomach. Large volume of gas and stool in the imaged colon is noted. IMPRESSION: As above. Electronically Signed   By: Inge Rise M.D.   On: 10/21/2018 10:53    Medications: Infusions: .  prismasol BGK 4/2.5 400 mL/hr at 10/21/18 2051  .  prismasol BGK 4/2.5 200 mL/hr at 10/21/18 2129  . sodium chloride    . sodium chloride    . dexmedetomidine (PRECEDEX) IV infusion 1.8 mcg/kg/hr (10/22/18 0749)  . feeding supplement  (NEPRO CARB STEADY) 1,000 mL (10/21/18 1555)  . norepinephrine (LEVOPHED) Adult infusion 7 mcg/min (10/22/18 0751)  . prismasol BGK 4/2.5 1,500 mL/hr at 10/22/18 0415    Scheduled Medications: . budesonide (PULMICORT) nebulizer solution  0.25 mg Nebulization BID  . chlorhexidine  15 mL Mouth Rinse BID  . Chlorhexidine Gluconate Cloth  6 each Topical Q0600  . feeding supplement (PRO-STAT SUGAR FREE 64)  30 mL Per Tube TID  . insulin aspart  0-9 Units Subcutaneous Q4H  . ipratropium-albuterol  3 mL Nebulization Q6H  . mouth rinse  15 mL Mouth Rinse q12n4p  . mupirocin ointment  1 application Nasal BID  .  vancomycin  500 mg Per Tube Q6H  . vancomycin (VANCOCIN) rectal ENEMA  500 mg Rectal Q6H    have reviewed scheduled and prn medications.  Physical Exam: General: Lying in bed, yellowish skin, unresponsive Heart:RRR, s1s2 nl no rubs Lungs: Coarse breath sound bilateral, no wheezing Abdomen:soft, Non-tender, non-distended Extremities:No edema Dialysis Access: Temporary IJ catheter.  Kerrion Kemppainen Prasad Evelynn Hench 10/22/2018,9:22 AM  LOS: 8 days

## 2018-10-22 NOTE — Progress Notes (Signed)
Long conversation per Dr. Loanne Drilling and myself this morning with patient's brother. Unfortunately there has been no clinical improvement despite CVVHD and not a candidate for long term dialysis. He remains encephalopathic in multiorgan failure. Persistent septic shock from c diff colitis requiring levophed. Decision was made to withdrawal care and transition to full comfort care only. DNR.   Velna Ochs, M.D. - PGY3 Pager: [phone] 10/22/2018, 11:35 AM

## 2018-10-22 NOTE — Progress Notes (Signed)
Subjective: No acute events.  Objective: Vital signs in last 24 hours: Temp:  [97.6 F (36.4 C)-98.4 F (36.9 C)] 97.6 F (36.4 C) (01/28 0427) Pulse Rate:  [69-92] 79 (01/28 0700) Resp:  [13-23] 20 (01/28 0700) BP: (80-144)/(50-92) 97/64 (01/28 0700) SpO2:  [93 %-100 %] 99 % (01/28 0700) Weight:  [103.2 kg] 103.2 kg (01/28 0115) Last BM Date: 10/21/18  Intake/Output from previous day: 01/27 0701 - 01/28 0700 In: 3174.7 [I.V.:2090.5; NG/GT:884.2] Out: 2982 [Stool:50] Intake/Output this shift: No intake/output data recorded.  General appearance: confused and thrashing about Resp: clear to auscultation bilaterally Cardio: regular rate and rhythm GI: soft, non-tender; bowel sounds normal; no masses,  no organomegaly Extremities: extremities normal, atraumatic, no cyanosis or edema  Lab Results: Recent Labs    10/19/18 1925 10/20/18 0457 10/20/18 0923 10/21/18 0315  WBC 21.2* 21.9*  --  24.7*  HGB 9.0* 10.0* 10.5* 9.7*  HCT 26.6* 29.5* 31.0* 30.0*  PLT 220 215  --  185   BMET Recent Labs    10/21/18 0315 10/21/18 1513 10/22/18 0441  NA 137 135 135  K 4.2 4.4 4.1  CL 101 102 100  CO2 24 21* 23  GLUCOSE 118* 135* 195*  BUN 72* 61* 56*  CREATININE 3.53* 2.78* 2.34*  CALCIUM 8.3* 8.8* 8.7*   LFT Recent Labs    10/20/18 0457  10/21/18 0315  10/22/18 0441  PROT 6.7  --  6.3*  --   --   ALBUMIN 2.4*  2.4*   < > 2.1*   < > 1.9*  AST 2,244*  --  2,267*  --   --   ALT 247*  --  451*  --   --   ALKPHOS 2,021*  --  2,270*  --   --   BILITOT 14.1*  --  16.0*  --   --   BILIDIR 9.3*  --   --   --   --   IBILI 4.8*  --   --   --   --    < > = values in this interval not displayed.   PT/INR Recent Labs    10/20/18 0457 10/21/18 0315  LABPROT 17.7* 16.4*  INR 1.47 1.33   Hepatitis Panel No results for input(s): HEPBSAG, HCVAB, HEPAIGM, HEPBIGM in the last 72 hours. C-Diff No results for input(s): CDIFFTOX in the last 72 hours. Fecal Lactopherrin No  results for input(s): FECLLACTOFRN in the last 72 hours.  Studies/Results: Ir Fluoro Guide Cv Line Right  Result Date: 10/21/2018 INDICATION: 63 year old with acute on chronic renal failure. Encephalopathy. Request for non tunneled dialysis catheter.  EXAM: FLUOROSCOPIC AND ULTRASOUND GUIDED PLACEMENT OF A NON-TUNNELED DIALYSIS CATHETER  Physician: Stephan Minister. Henn, MD  MEDICATIONS: None  ANESTHESIA/SEDATION: Ativan 0.5 mg prior to the procedure  FLUOROSCOPY TIME:  Fluoroscopy Time:  minutes  seconds ( mGy).  COMPLICATIONS: None immediate.  PROCEDURE: Patient is encephalopathic and can not give informed consent. There is no family or POA available for consent. Therefore, emergency consent was performed. Patient was combative and unable to perform the procedure on the fluoroscopic table. Procedure was performed in the patient's bed. Ultrasound confirmed a patent right internal jugular vein. Ultrasound images were obtained for documentation. The right side of the neck was prepped and draped in a sterile fashion. The right side of the neck was anesthetized with 1% lidocaine. Maximal barrier sterile technique was utilized including caps, mask, sterile gowns, sterile gloves, sterile drape, hand hygiene and skin antiseptic. A  small incision was made with #11 blade scalpel. A 21 gauge needle directed into the right internal jugular vein with ultrasound guidance. A micropuncture dilator set was placed. A 16 cm Mahurkar catheter was selected. The catheter was advanced easily over the stiff Amplatz wire. Both dialysis lumens were found to aspirate and flush well. The proper amount of heparin was flushed in both lumens. The central venous lumen was flushed with normal saline. Catheter was sutured to skin. Dressing was placed over the catheter.  Follow-up portable chest x-ray demonstrated that the central line was in the lower SVC and appropriately positioned.  FINDINGS: Catheter tip in the lower SVC based on  follow-up chest radiograph.  IMPRESSION: Successful placement of a right jugular non-tunneled dialysis catheter using ultrasound guidance.   Electronically Signed   By: Markus Daft M.D.   On: 10/18/2018 12:52  Dg Abd Portable 1v  Result Date: 10/21/2018 CLINICAL DATA:  Status post feeding tube placement. EXAM: PORTABLE ABDOMEN - 1 VIEW COMPARISON:  None. FINDINGS: Feeding tube is in place with its tip in the fundus of the stomach. Large volume of gas and stool in the imaged colon is noted. IMPRESSION: As above. Electronically Signed   By: Inge Rise M.D.   On: 10/21/2018 10:53    Medications:  Scheduled: . budesonide (PULMICORT) nebulizer solution  0.25 mg Nebulization BID  . chlorhexidine  15 mL Mouth Rinse BID  . Chlorhexidine Gluconate Cloth  6 each Topical Q0600  . feeding supplement (PRO-STAT SUGAR FREE 64)  30 mL Per Tube TID  . insulin aspart  0-9 Units Subcutaneous Q4H  . ipratropium-albuterol  3 mL Nebulization Q6H  . mouth rinse  15 mL Mouth Rinse q12n4p  . mupirocin ointment  1 application Nasal BID  . vancomycin  500 mg Per Tube Q6H  . vancomycin (VANCOCIN) rectal ENEMA  500 mg Rectal Q6H   Continuous: .  prismasol BGK 4/2.5 400 mL/hr at 10/21/18 2051  .  prismasol BGK 4/2.5 200 mL/hr at 10/21/18 2129  . sodium chloride    . sodium chloride    . dexmedetomidine (PRECEDEX) IV infusion 1.8 mcg/kg/hr (10/22/18 0600)  . feeding supplement (NEPRO CARB STEADY) 1,000 mL (10/21/18 1555)  . norepinephrine (LEVOPHED) Adult infusion 6 mcg/min (10/22/18 0600)  . prismasol BGK 4/2.5 1,500 mL/hr at 10/22/18 0415    Assessment/Plan: 1) C. Diff. 2) Persistent AMS. 3) Abnormal liver enzymes. 4) Renal failure.   Since his admission there has not been any significant change in his clinical status.  He still remains encephalopathic.  He is being treatedwith vancomycin, but his WBC continues to be elevated.    Plan: 1) No new recommendations. 2) Continue with vancomycin  enema.  LOS: 8 days   Issaiah Seabrooks D 10/22/2018, 7:33 AM

## 2018-10-22 NOTE — Progress Notes (Signed)
Support given to brother, Campbell Lerner, and offered for continued support to siblings. CRRT d/cd and patient now DNR.

## 2018-10-22 NOTE — Progress Notes (Signed)
Nutrition Brief Note  Chart reviewed. Pt now transitioning to comfort care.  No further nutrition interventions warranted at this time.  Please re-consult as needed.    Molli Barrows, RD, LDN, [place] Pager [phone] After Hours Pager [phone]

## 2018-10-22 NOTE — Progress Notes (Signed)
tx to 6N 28, recvd by RN and NT. Report confirmed at bs

## 2018-10-22 NOTE — Progress Notes (Signed)
Transferred from 72M via bed
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
## 2018-10-22 NOTE — Progress Notes (Signed)
   10/22/18 1000  Clinical Encounter Type  Visited With Patient;Family  Visit Type Initial;Spiritual support  Referral From Nurse  Consult/Referral To Chaplain  Spiritual Encounters  Spiritual Needs Emotional;Grief support;Prayer;Literature  Stress Factors  Family Stress Factors Loss of control;Major life changes;Health changes;Family relationships   Responded to page from Nurse for PT and family care. Nurse shared history of PT and brothers recent care and family dynamics. Brother was on consult area as he shared his concerns to me about PT's diagnosis and how to process what will be coming next. Brother wanted prayer over PT. I offered spiritual care with ministry of presence, a listening ear, words of encouragement, sacred text, and prayer. Chaplain available upon request.  Chaplain Fidel Levy  [phone]

## 2018-10-22 NOTE — Social Work (Signed)
CSW acknowledging consult for comfort care. Will follow for disposition should hospice services or residential hospice become appropriate.   Alexander Mt, [place] Work [phone]

## 2018-10-22 NOTE — Progress Notes (Signed)
Report called to [facility] for 6N 28. [place] organ donation notified of current status and change to comfort care w/ referral # of [phone] per rep Vivianne Master.

## 2018-10-26 NOTE — Progress Notes (Signed)
@   0640 noted pt not breathing, no pulse, verified by charge nurse. Text page on call K. Schorr,NP. @[phone] left message to brother, Sammy to call us back. Able to talk to pt sister via phone call and notified pt death. Pt is not a candidate for organ donation.

## 2018-10-26 NOTE — Significant Event (Signed)
RN just paged a few minutes ago that pt expired. [facility] has never seen the pt. Np assumes we were going to take over pt's care today as he was made comfort care. NP called [facility] attending to inform him that he may want critical care to sign death certificate since [facility] has not seen the pt.  KJKG, NP [facility]

## 2018-10-26 NOTE — Progress Notes (Signed)
Pt had personal Items here. I phoned both brother, and left message on voice mail to return call here.  I was able to speak to the sister.  She will talk to her brother, Javier Ho and have him come and get his belongings.

## 2018-10-26 NOTE — Discharge Summary (Signed)
Discharge Summary:  Name: Javier Ho. MRN: [id number] DOB: 07-May-1956 63 y.o.  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 was discharged from [facility] in expired condition.    Hospital Course & Death Summary: Patient is a 63 yo M with a history of COPD, diastolic heart failure, CKD IV, diabetes, and polysubstance abuse who was brought to the ED by friends with altered mental status and multiple falls. Found to have profound transaminitis with hyperbilirubinemia and underlying (previously undiagnosed) cirrhosis. In addition, he was in acute on chronic renal failure. Imaging was concerning for possible cholecystitis with distended gallbladder but abdominal exam was benign. No stones or biliary dilation noted. HIDA scan was abnormal but this was felt to be secondary to hepatocellular issues. General surgery and GI were consulted and he was deemed not a candidate for invasive procedures. He completed a 7 day course of empiric zosyn for possible intraabdominal infection. Unfortunately mental status did not improve and renal function continued to decline. There was concern for uremic encephalopathy and patient was transferred to the ICU on 1/25 for precedex and initiation of CRRT. Course was complicated by c diff colitis with ileus and septic shock requiring vancomycin enemas and pressors. Patient  received three days of CRRT with no improvement in his clinical or mental status despite decrease in his BUN. Nephrology deemed he was not a candidate for long term dialysis. Brother decided to withdrawal care on 1/28 and transition to comfort measures. Dialysis was stopped and passed the following morning at 06:40.    Signed: Velna Ochs, MD 11/02/2018, 10:30 AM

## 2018-10-26 DEATH — deceased

## 2018-10-30 ENCOUNTER — Telehealth: Payer: Self-pay | Admitting: *Deleted

## 2018-10-30 NOTE — Telephone Encounter (Signed)
Received D/C from [facility]D/C Forwarded to [place] to be signed.

## 2018-10-31 NOTE — Telephone Encounter (Signed)
Received signed D/C-[facility] notified for pick up.

## 2018-11-25 ENCOUNTER — Ambulatory Visit: Payer: Medicare HMO | Admitting: Nurse Practitioner

## 2020-04-03 IMAGING — CT CT RENAL STONE PROTOCOL
2 of 4 series · 16 of 46 positions shown, 18 images · non-contrast
Comparison: Abdominal radiograph dated 10/10/2018 and CT dated
11/28/2017

CLINICAL DATA: 62-year-old male with abdominal distention.

EXAM:
CT ABDOMEN AND PELVIS WITHOUT CONTRAST
TECHNIQUE: Multidetector CT imaging of the abdomen and pelvis was performed
following the standard protocol without IV contrast.

[Series 3: ap without · axial · non-contrast · 0.91mm/px · z∈[+743,+1218]mm · 13 of 107 slices shown, 15 images]
[im 6/107  soft-tissue]
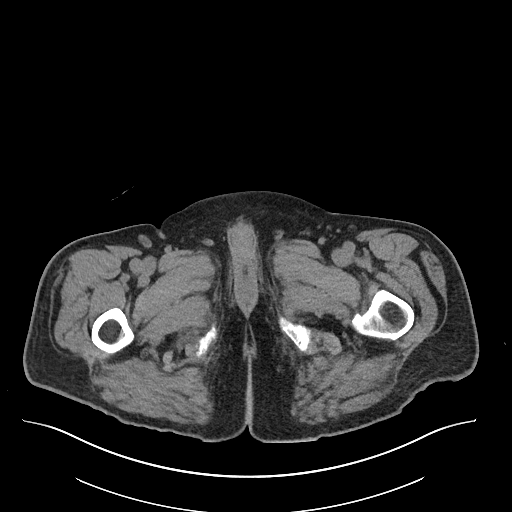
[im 6/107  bone]
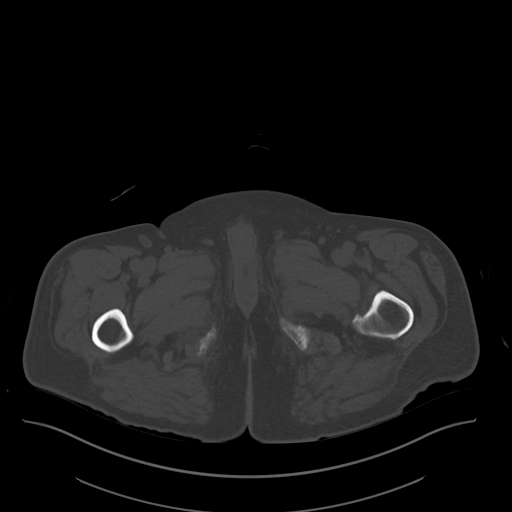
[im 12/107  soft-tissue]
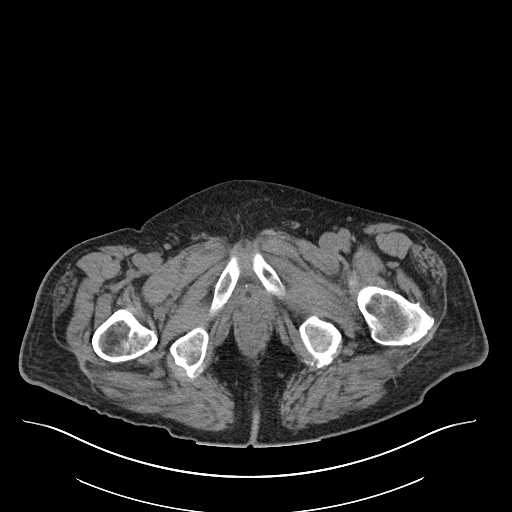
[im 24/107  soft-tissue]
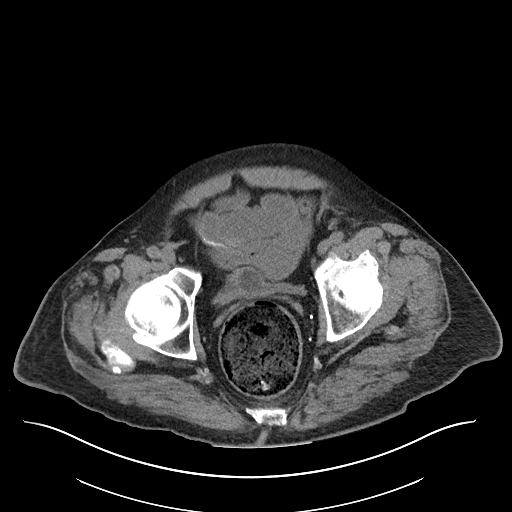
[im 30/107  soft-tissue]
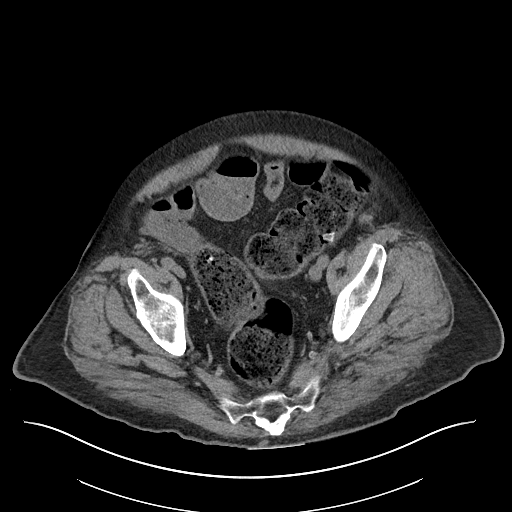
[im 36/107  soft-tissue]
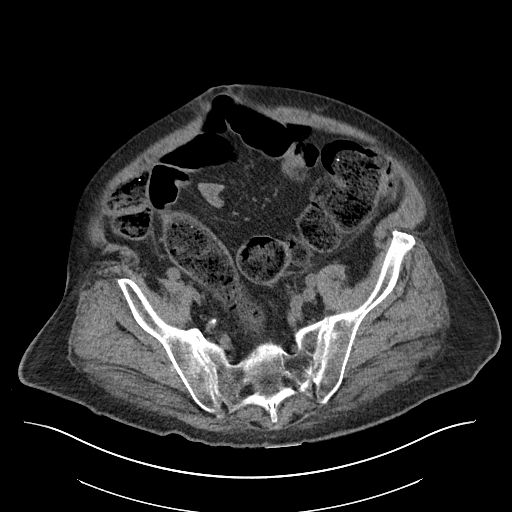
[im 48/107  soft-tissue]
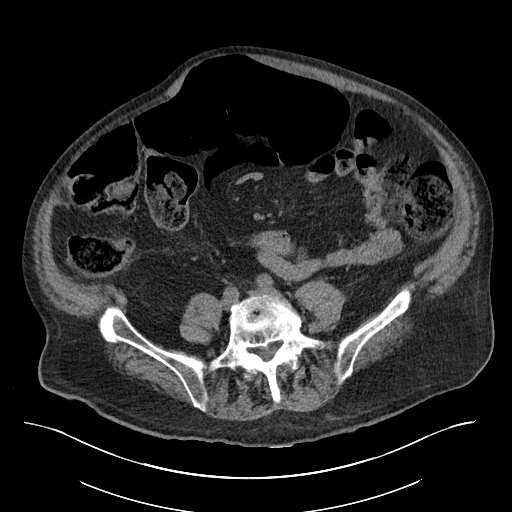
[im 54/107  soft-tissue]
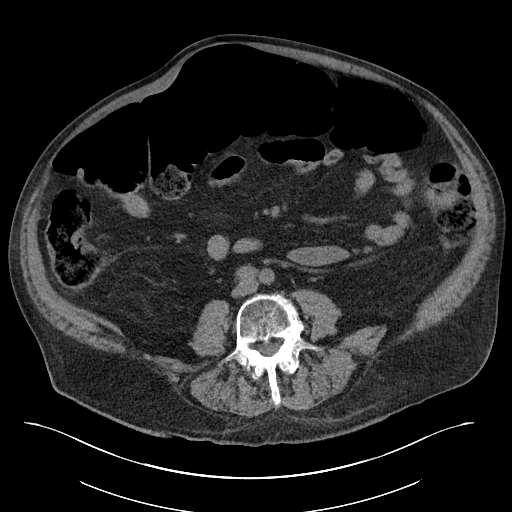
[im 59/107  soft-tissue]
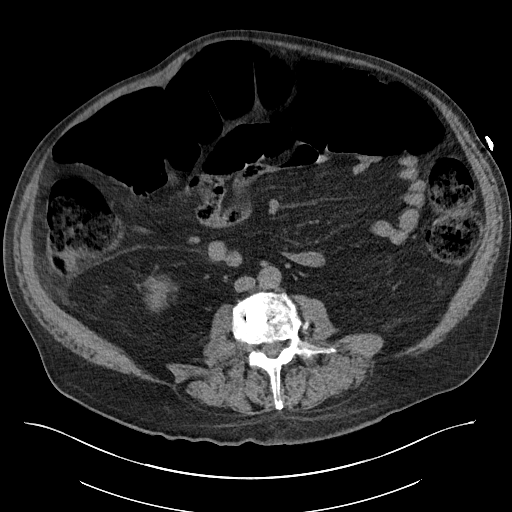
[im 71/107  soft-tissue]
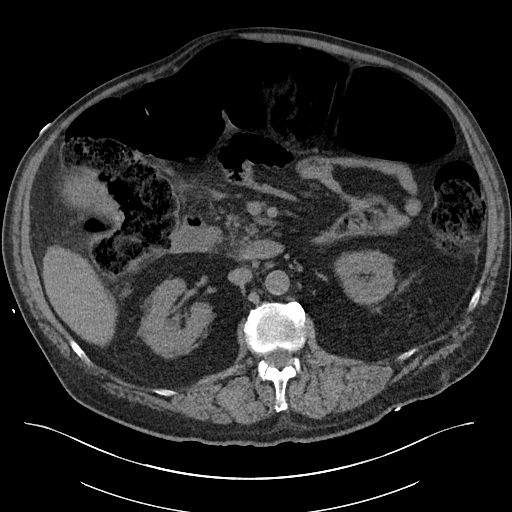
[im 71/107  bone]
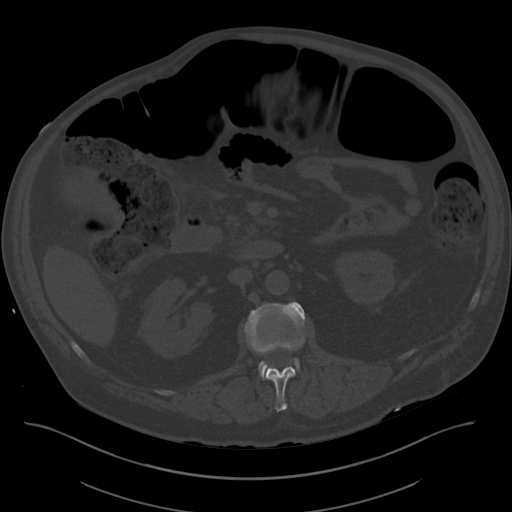
[im 77/107  soft-tissue]
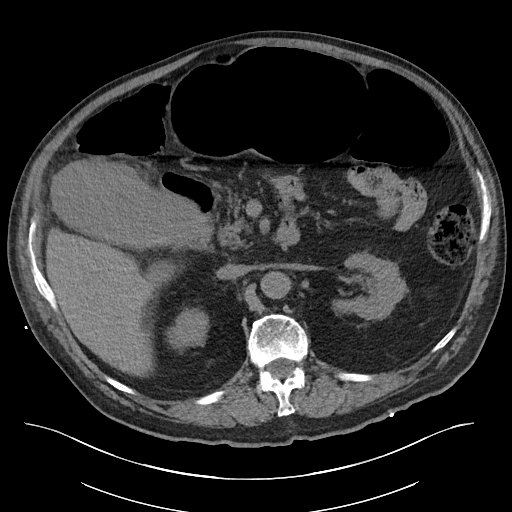
[im 83/107  soft-tissue]
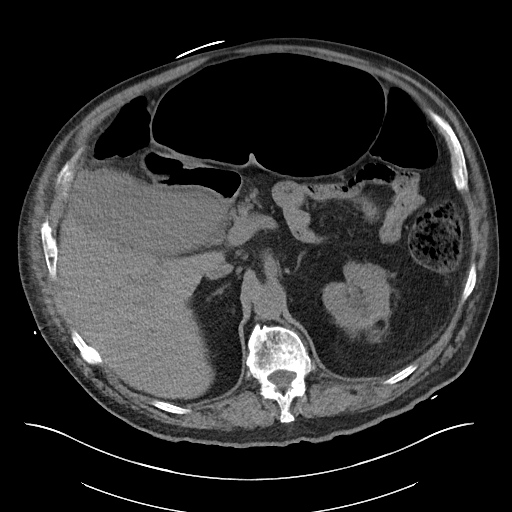
[im 95/107  soft-tissue]
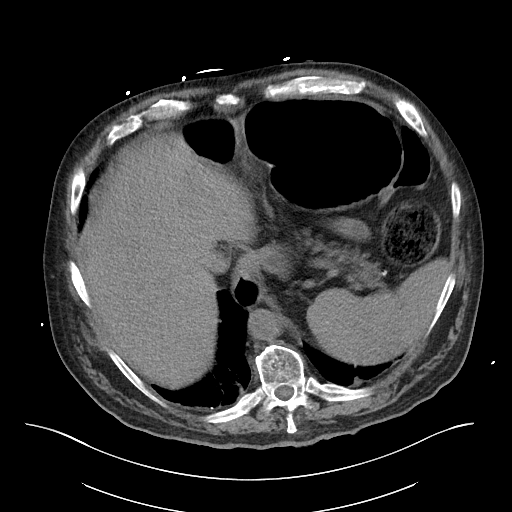
[im 101/107  soft-tissue]
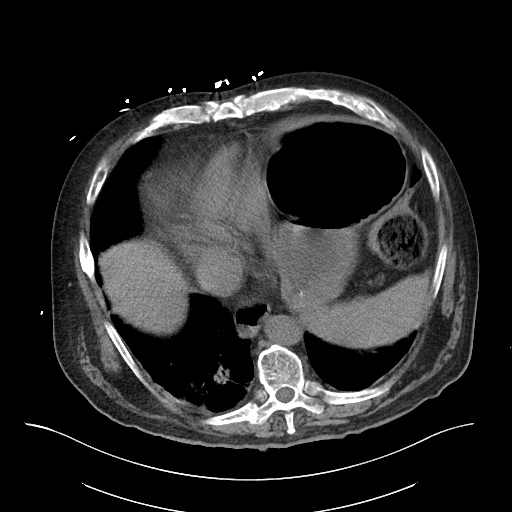

[Series 6: cor · coronal · 1.02mm/px · 3 of 134 slices shown]
[im 45/134  soft-tissue]
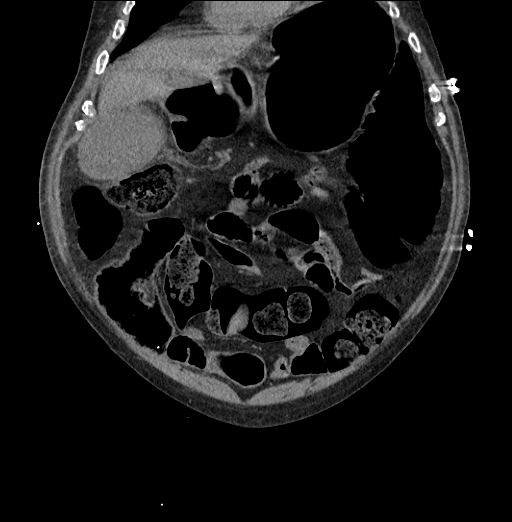
[im 60/134  soft-tissue]
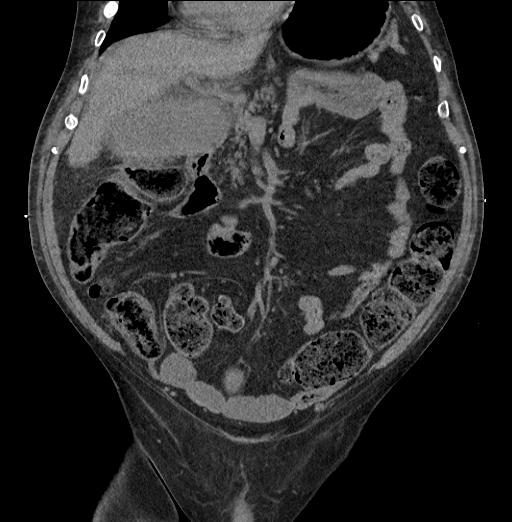
[im 74/134  soft-tissue]
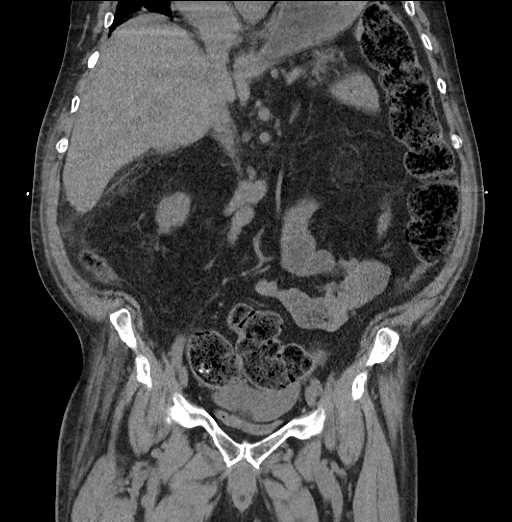

[16 of 46 positions shown; findings below may reference images not displayed]

FINDINGS: Evaluation of this exam is limited in the absence of intravenous
contrast.

Lower chest: There is eventration of the left hemidiaphragm. There
are bibasilar atelectasis/scarring. Evaluation of the lungs is
limited due to respiratory motion artifact. There is shift of the
mediastinum to the right of the midline secondary to mass effect
caused by elevated left hemidiaphragm. This is similar to prior CT.

No intra-abdominal free air. Small perihepatic free fluid.

Hepatobiliary: The liver is unremarkable as visualized. No
intrahepatic biliary ductal dilatation. The gallbladder is distended
and contains slightly high attenuating content, likely hyper
concentrated bile or sludge. There is diffuse haziness of the wall
of the gallbladder and pericholecystic fat concerning for acute
cholecystitis. Further evaluation with ultrasound recommended.

Pancreas: Unremarkable. No pancreatic ductal dilatation or
surrounding inflammatory changes.

Spleen: Normal in size without focal abnormality.

Adrenals/Urinary Tract: The adrenal glands are unremarkable. There
is no hydronephrosis or nephrolithiasis on either side. There has
been interval removal of the previously seen percutaneous
nephrostomy tubes and staghorn calculi. A 2 cm exophytic hypodense
lesion from the posterior interpolar left kidney is not
characterized but appears similar to prior CT. The visualized
ureters appear unremarkable. The urinary bladder is decompressed
around a Foley catheter.

Stomach/Bowel: There is large amount of dense stool throughout the
colon and in the rectum. The sigmoid colon is redundant. There is
air distention of the transverse colon measuring up to 8.5 cm in
diameter. No evidence of mechanical obstruction. The small bowel is
unremarkable. No evidence of acute appendicitis.

Vascular/Lymphatic: The abdominal aorta and IVC are grossly
unremarkable on this noncontrast CT. No portal venous gas. There is
no adenopathy.

Reproductive: The prostate and seminal vesicles are are grossly
unremarkable. No pelvic mass.

Other: None

Musculoskeletal: Osteopenia with degenerative changes of the spine.
Multilevel compression fracture primarily at L3 and L5 similar to
prior CT. There is vertebroplasty changes at L3. No acute fracture.
IMPRESSION: 1. Distended gallbladder with findings concerning for acute
cholecystitis. Further evaluation with ultrasound recommended.
2. Severe constipation with possible fecal impaction within the
rectum. No bowel obstruction.
3. Interval removal of the previously seen percutaneous nephrostomy
tubes and staghorn calculi. No hydronephrosis or nephrolithiasis.

## 2020-04-03 IMAGING — DX DG CHEST 1V PORT
1 series · 2 of 2 positions shown · non-contrast
Comparison: 10/10/2018, 08/26/2018

CLINICAL DATA: Cough, short of breath

EXAM:
PORTABLE CHEST 1 VIEW

[Series 1: chest · 0.14mm/px · 2 of 2 slices shown]
[im 1/2]
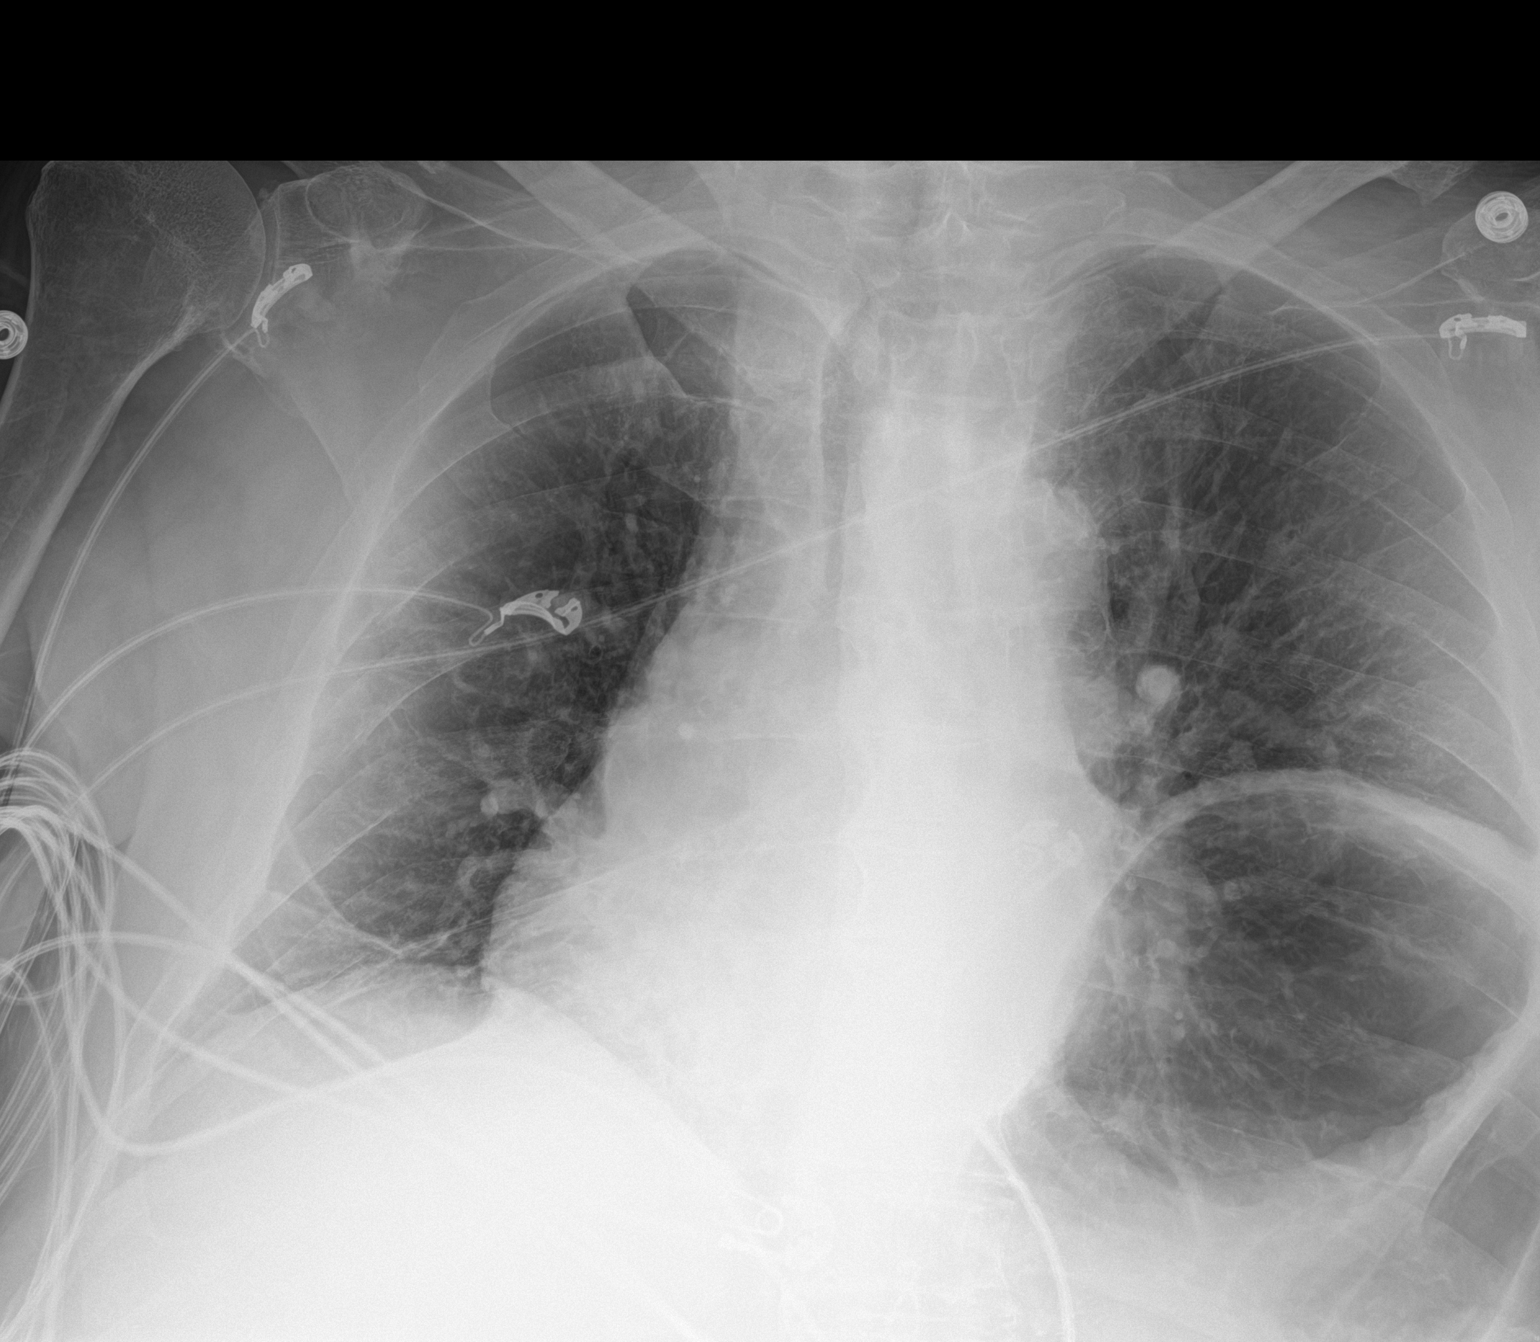
[im 2/2]
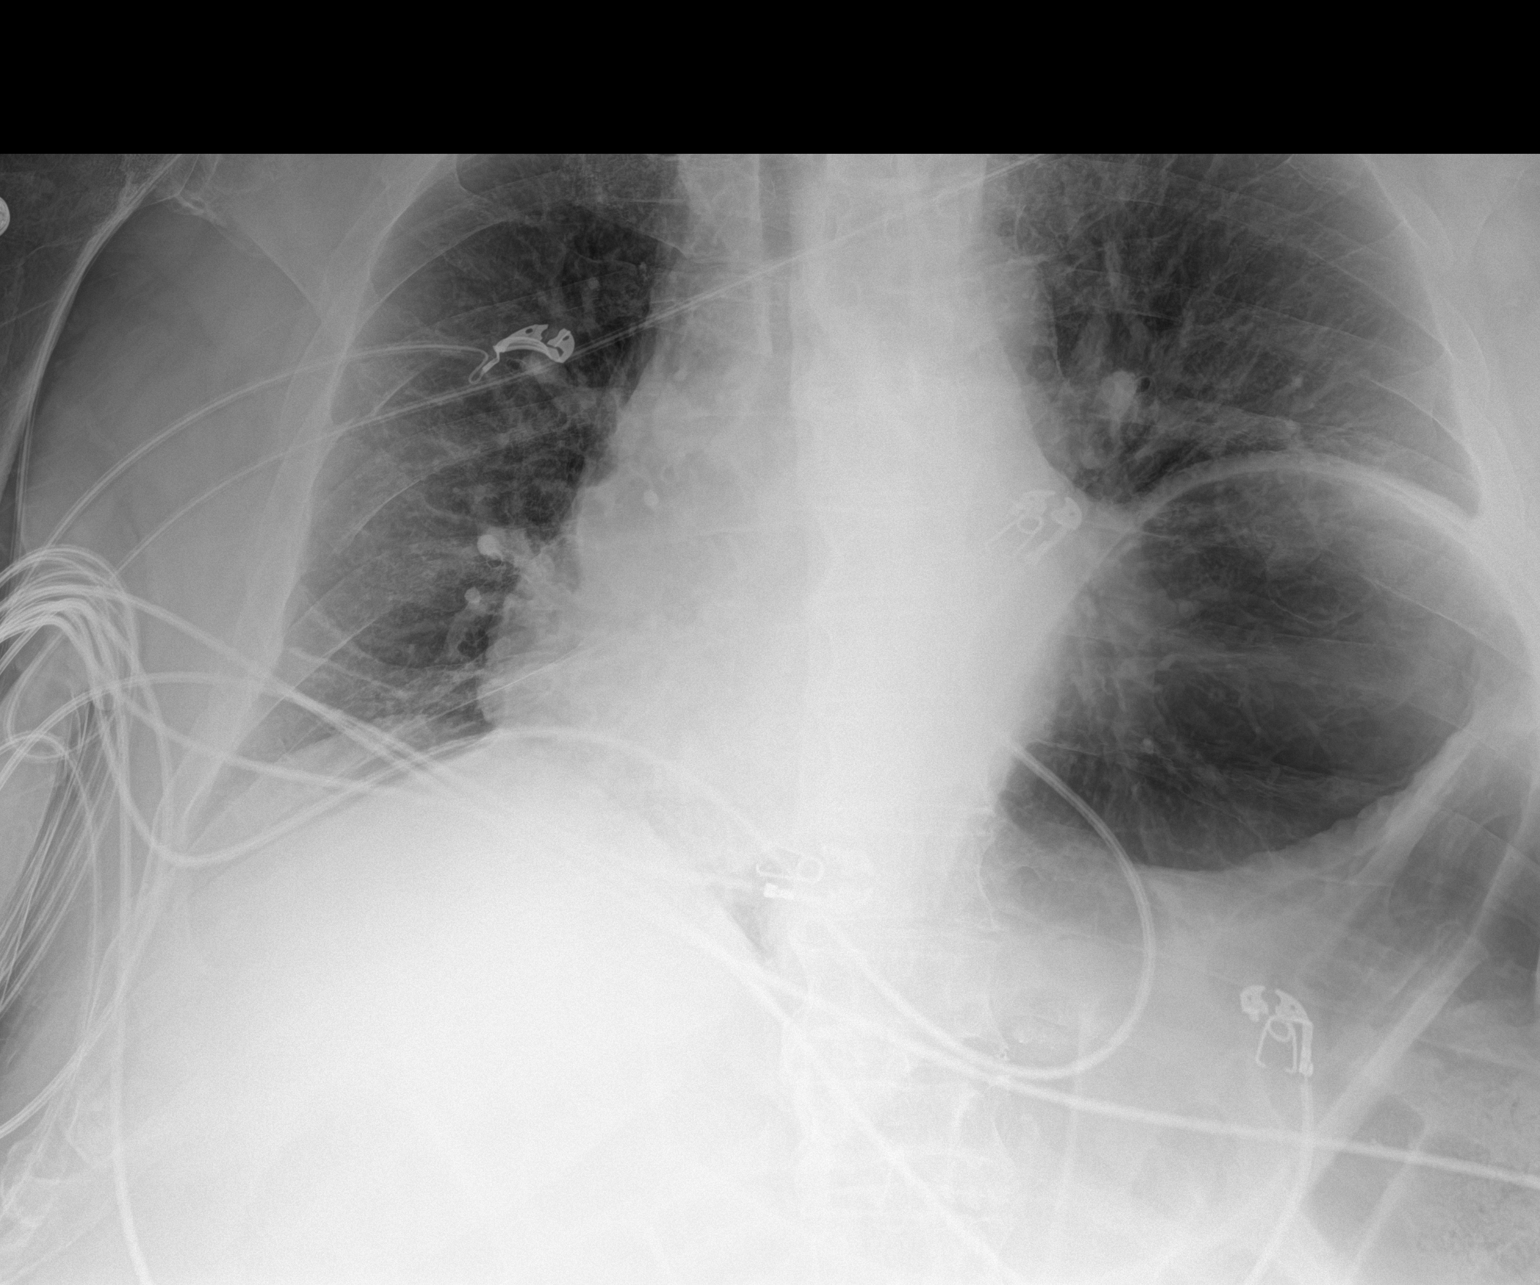

[2 of 2 positions shown; findings below may reference images not displayed]

FINDINGS: Elevation of left diaphragm. Linear scarring or atelectasis at the
right base. No consolidation or effusion. Stable cardiomediastinal
silhouette. No pneumothorax.
IMPRESSION: No active disease. Stable elevation of left diaphragm with basilar
atelectasis or scarring
